# Patient Record
Sex: Female | Born: 1935 | Smoking: Never smoker
Health system: Southern US, Community
[De-identification: ages and names within clinical notes are randomized; demographics above are authoritative.]

## PROBLEM LIST (undated history)

## (undated) DIAGNOSIS — R32 Unspecified urinary incontinence: Secondary | ICD-10-CM

## (undated) DIAGNOSIS — H9190 Unspecified hearing loss, unspecified ear: Secondary | ICD-10-CM

## (undated) DIAGNOSIS — E785 Hyperlipidemia, unspecified: Secondary | ICD-10-CM

## (undated) DIAGNOSIS — I10 Essential (primary) hypertension: Secondary | ICD-10-CM

## (undated) DIAGNOSIS — M1990 Unspecified osteoarthritis, unspecified site: Secondary | ICD-10-CM

## (undated) HISTORY — DX: Unspecified osteoarthritis, unspecified site: M19.90

## (undated) HISTORY — DX: Unspecified hearing loss, unspecified ear: H91.90

## (undated) HISTORY — DX: Essential (primary) hypertension: I10

## (undated) HISTORY — PX: ANAL FISSURE REPAIR: SHX2312

## (undated) HISTORY — PX: HEMORRHOID SURGERY: SHX153

## (undated) HISTORY — DX: Hyperlipidemia, unspecified: E78.5

## (undated) HISTORY — PX: KNEE ARTHROPLASTY: SHX992

## (undated) HISTORY — DX: Unspecified urinary incontinence: R32

## (undated) HISTORY — PX: HIP ARTHROPLASTY: SHX981

---

## 2015-06-01 ENCOUNTER — Encounter: Payer: Self-pay | Admitting: Family Medicine

## 2015-06-01 ENCOUNTER — Ambulatory Visit (INDEPENDENT_AMBULATORY_CARE_PROVIDER_SITE_OTHER): Payer: Medicare Other | Admitting: Family Medicine

## 2015-06-01 VITALS — BP 150/82 | HR 58 | Temp 97.6°F | Ht <= 58 in | Wt 152.0 lb

## 2015-06-01 DIAGNOSIS — R32 Unspecified urinary incontinence: Secondary | ICD-10-CM

## 2015-06-01 DIAGNOSIS — M1990 Unspecified osteoarthritis, unspecified site: Secondary | ICD-10-CM

## 2015-06-01 DIAGNOSIS — E785 Hyperlipidemia, unspecified: Secondary | ICD-10-CM | POA: Diagnosis present

## 2015-06-01 DIAGNOSIS — Z7189 Other specified counseling: Secondary | ICD-10-CM

## 2015-06-01 DIAGNOSIS — I10 Essential (primary) hypertension: Secondary | ICD-10-CM | POA: Diagnosis not present

## 2015-06-01 DIAGNOSIS — Z7689 Persons encountering health services in other specified circumstances: Secondary | ICD-10-CM

## 2015-06-01 NOTE — Patient Instructions (Signed)
Nice to meet you today!  Use Aveeno lotion on your skin Let's see if that helps the rash  On your way out, schedule an appointment one morning to come back for fasting labs. Do not eat or drink anything other than water the morning of your lab appointment until after your labs are drawn.   We will get records from Dr. Deatra JamesKernis  Follow up with me in 1 month  Be well, Dr. Pollie MeyerMcIntyre

## 2015-06-05 ENCOUNTER — Encounter: Payer: Self-pay | Admitting: Family Medicine

## 2015-06-05 DIAGNOSIS — M1990 Unspecified osteoarthritis, unspecified site: Secondary | ICD-10-CM | POA: Insufficient documentation

## 2015-06-05 DIAGNOSIS — R32 Unspecified urinary incontinence: Secondary | ICD-10-CM | POA: Insufficient documentation

## 2015-06-05 DIAGNOSIS — E785 Hyperlipidemia, unspecified: Secondary | ICD-10-CM | POA: Insufficient documentation

## 2015-06-05 NOTE — Assessment & Plan Note (Signed)
Return for fasting labs, titrate statin as needed based on results.

## 2015-06-05 NOTE — Assessment & Plan Note (Signed)
Well controlled on oxybutinin.

## 2015-06-05 NOTE — Assessment & Plan Note (Signed)
Well controlled. Continue current regimen. Return for fasting labs. 

## 2015-06-05 NOTE — Assessment & Plan Note (Signed)
Well controlled with extra strength tylenol, continue.

## 2015-06-05 NOTE — Progress Notes (Signed)
Date of Visit: 06/01/2015    HPI:  Patient presents today for a new patient appointment to establish general primary care. Patient previously a patient of Dr. Deatra JamesKernis in [place] [phone]). Last saw her in November 2016. Recently moved down here to live with son.  Rash - has noticed pink colored areas on skin, especially arms. Ongoing for 4-5 months, occurs occasionally. No fevers, sores in mouth/genitals. Declines dermatology referral  ROS: See HPI ROS sheet positive for: Trouble hearing, horaseness/voice changing, swelling of hands/feet, frequent urination, joint pain/swelling  Past Medical Hx:  -arthritis - previously had both hips and right knee replaced. Now takes extra strength tylenol as needed. Prior doctors were Dr. Tiburcio PeaHarris and [facility] -hypertension - takes atenolol -hyperlipidemia - takes simvastatin -urinary incontinence - takes oxybutinin -decreased hearing - doesn't have hearing aids, can't afford  Past Surgical Hx:  -left hip replacement 2006 -right hip replacement 2013 -right knee replacement 2015 -anal fissure & hemorrhoid surgeries  Obstetrical History: [id number]   Family Hx: updated in Epic  Social Hx: Widowed. Retired. High school graduate. Lives with 641 year old son Michele Mcalpinehil. Son drives her to appts. No regular exercise. Has never used tobacco. No drugs or alcohol. Denies being at risk for having an STD. Denies depressive symptoms.   Health Maintenance:  -colonoscopy - last had 10 years ago. Does not want to ever have another due to age -mammograms - have all been normal. Declines future mammo's due to age -DEXA - reports possible osteopenia, had multiples in the past -Pneumovax - 2015 -stress test - had preoperative stress test 1 year ago which was normal reportedly  PHYSICAL EXAM: BP 150/82 mmHg  Pulse 58  Temp(Src) 97.6 F (36.4 C) (Oral)  Ht 4\' 10"  (1.473 m)  Wt 152 lb (68.947 kg)  BMI 31.78 kg/m2  SpO2 96% Gen:  NAD, pleasant, cooperative HEENT: normocephalic, atraumatic, moist mucous membranes  Heart: regular rate and rhythm, no murmur Lungs: clear to auscultation bilaterally, normal work of breathing  Abdomen: soft, nontender to palpation  Neuro: alert, grossly nonfocal, speech normal Extremities: minimal lower extremity edema bilaterally Skin: faint salmon/pink colored patches on arms  ASSESSMENT/PLAN:  Establish care - patient will complete ROI so we can obtain records from prior PCP.  Rash - no red flags, doubt serious etiology. Declines dermatology referral at this time. Recommend applying aveeno lotion and monitoring for relief. Follow up in 1 month   Essential hypertension, benign Well controlled. Continue current regimen. Return for fasting labs  Arthritis Well controlled with extra strength tylenol, continue.  Hyperlipidemia Return for fasting labs, titrate statin as needed based on results.  Urinary incontinence Well controlled on oxybutinin.     FOLLOW UP: Follow up in 1 month for rash & establishing care once records available. Schedule fasting lab visit   GrenadaBrittany J. Pollie MeyerMcIntyre, MD [facility]

## 2015-06-15 ENCOUNTER — Other Ambulatory Visit: Payer: Medicare Other

## 2015-06-15 DIAGNOSIS — E785 Hyperlipidemia, unspecified: Secondary | ICD-10-CM

## 2015-06-15 LAB — CBC
HCT: 39.8 % (ref 35.0–45.0)
HEMOGLOBIN: 13 g/dL (ref 11.7–15.5)
MCH: 28.2 pg (ref 27.0–33.0)
MCHC: 32.7 g/dL (ref 32.0–36.0)
MCV: 86.3 fL (ref 80.0–100.0)
MPV: 11.6 fL (ref 7.5–12.5)
Platelets: 241 10*3/uL (ref 140–400)
RBC: 4.61 MIL/uL (ref 3.80–5.10)
RDW: 15.1 % — ABNORMAL HIGH (ref 11.0–15.0)
WBC: 7.8 10*3/uL (ref 3.8–10.8)

## 2015-06-15 NOTE — Progress Notes (Signed)
Labs done today Savon Bordonaro 

## 2015-06-16 ENCOUNTER — Encounter: Payer: Self-pay | Admitting: Family Medicine

## 2015-06-16 LAB — COMPLETE METABOLIC PANEL WITH GFR
ALBUMIN: 3.9 g/dL (ref 3.6–5.1)
ALK PHOS: 56 U/L (ref 33–130)
ALT: 11 U/L (ref 6–29)
AST: 16 U/L (ref 10–35)
BUN: 18 mg/dL (ref 7–25)
CALCIUM: 9.4 mg/dL (ref 8.6–10.4)
CHLORIDE: 108 mmol/L (ref 98–110)
CO2: 20 mmol/L (ref 20–31)
Creat: 0.61 mg/dL (ref 0.60–0.88)
GFR, EST NON AFRICAN AMERICAN: 81 mL/min (ref >=60)
GFR, Est African American: >89 mL/min (ref >=60)
Glucose, Bld: 87 mg/dL (ref 65–99)
POTASSIUM: 4.7 mmol/L (ref 3.5–5.3)
SODIUM: 141 mmol/L (ref 135–146)
Total Bilirubin: 0.5 mg/dL (ref 0.2–1.2)
Total Protein: 6.9 g/dL (ref 6.1–8.1)

## 2015-06-16 LAB — LIPID PANEL
CHOL/HDL RATIO: 3 ratio (ref <=5.0)
CHOLESTEROL: 256 mg/dL — AB (ref 125–200)
HDL: 84 mg/dL (ref >=46)
LDL Cholesterol: 141 mg/dL — ABNORMAL HIGH (ref <130)
Triglycerides: 154 mg/dL — ABNORMAL HIGH (ref <150)
VLDL: 31 mg/dL — AB (ref <30)

## 2015-06-29 ENCOUNTER — Ambulatory Visit: Payer: Medicare Other | Admitting: Family Medicine

## 2016-08-13 ENCOUNTER — Ambulatory Visit: Payer: Medicare Other | Admitting: Family Medicine

## 2016-08-17 DIAGNOSIS — E785 Hyperlipidemia, unspecified: Secondary | ICD-10-CM | POA: Diagnosis not present

## 2016-08-17 DIAGNOSIS — R011 Cardiac murmur, unspecified: Secondary | ICD-10-CM | POA: Diagnosis not present

## 2016-08-17 DIAGNOSIS — R7309 Other abnormal glucose: Secondary | ICD-10-CM | POA: Diagnosis not present

## 2016-08-17 DIAGNOSIS — Z6832 Body mass index (BMI) 32.0-32.9, adult: Secondary | ICD-10-CM | POA: Diagnosis not present

## 2016-08-17 DIAGNOSIS — E784 Other hyperlipidemia: Secondary | ICD-10-CM | POA: Diagnosis not present

## 2016-08-17 DIAGNOSIS — R000 Tachycardia, unspecified: Secondary | ICD-10-CM | POA: Diagnosis not present

## 2016-08-17 DIAGNOSIS — N3281 Overactive bladder: Secondary | ICD-10-CM | POA: Diagnosis not present

## 2016-08-17 DIAGNOSIS — R079 Chest pain, unspecified: Secondary | ICD-10-CM | POA: Diagnosis not present

## 2016-08-17 DIAGNOSIS — J0100 Acute maxillary sinusitis, unspecified: Secondary | ICD-10-CM | POA: Diagnosis not present

## 2016-08-17 DIAGNOSIS — I10 Essential (primary) hypertension: Secondary | ICD-10-CM | POA: Diagnosis not present

## 2016-08-27 DIAGNOSIS — N3281 Overactive bladder: Secondary | ICD-10-CM | POA: Diagnosis not present

## 2016-08-27 DIAGNOSIS — R011 Cardiac murmur, unspecified: Secondary | ICD-10-CM | POA: Diagnosis not present

## 2016-08-27 DIAGNOSIS — R7309 Other abnormal glucose: Secondary | ICD-10-CM | POA: Diagnosis not present

## 2016-08-27 DIAGNOSIS — I10 Essential (primary) hypertension: Secondary | ICD-10-CM | POA: Diagnosis not present

## 2016-08-27 DIAGNOSIS — E785 Hyperlipidemia, unspecified: Secondary | ICD-10-CM | POA: Diagnosis not present

## 2016-08-27 DIAGNOSIS — E669 Obesity, unspecified: Secondary | ICD-10-CM | POA: Diagnosis not present

## 2017-06-07 ENCOUNTER — Ambulatory Visit (INDEPENDENT_AMBULATORY_CARE_PROVIDER_SITE_OTHER): Payer: Medicare Other | Admitting: Family Medicine

## 2017-06-07 VITALS — BP 140/80 | HR 58 | Temp 98.7°F | Wt 153.6 lb

## 2017-06-07 DIAGNOSIS — I10 Essential (primary) hypertension: Secondary | ICD-10-CM | POA: Diagnosis present

## 2017-06-07 DIAGNOSIS — Z23 Encounter for immunization: Secondary | ICD-10-CM | POA: Diagnosis not present

## 2017-06-07 DIAGNOSIS — R32 Unspecified urinary incontinence: Secondary | ICD-10-CM | POA: Diagnosis not present

## 2017-06-07 DIAGNOSIS — J309 Allergic rhinitis, unspecified: Secondary | ICD-10-CM

## 2017-06-07 DIAGNOSIS — H6121 Impacted cerumen, right ear: Secondary | ICD-10-CM

## 2017-06-07 MED ORDER — CETIRIZINE HCL 5 MG PO TABS: 5.0000 mg | ORAL_TABLET | Freq: Every day | ORAL | 1 refills | Status: DC

## 2017-06-07 MED ORDER — OXYBUTYNIN CHLORIDE 5 MG PO TABS: 5.0000 mg | ORAL_TABLET | Freq: Two times a day (BID) | ORAL | 1 refills | Status: AC

## 2017-06-07 MED ORDER — ATENOLOL 50 MG PO TABS: 50.0000 mg | ORAL_TABLET | Freq: Two times a day (BID) | ORAL | 1 refills | Status: DC

## 2017-06-07 NOTE — Patient Instructions (Signed)
It was great to see you again today!  Start zyrtec 5mg  daily for allergies Sending you to ENT doctor for wax removal Flu shot today  Sent in cheaper version of oxybutynin  Also refilled atenolol  Follow up with me in 3 months   Be well, Dr. Pollie MeyerMcIntyre

## 2017-06-07 NOTE — Assessment & Plan Note (Signed)
-  Well controlled; continue current regimen -Atenolol refilled today

## 2017-06-07 NOTE — Progress Notes (Signed)
Date of Visit: 06/07/2017   HPI:  Patient presents today for a well woman exam. I have seen her just once before in March 2017, when [facility]. She reports in the interim she has spent time in [place] and seen a PCP there as well.  Concerns today: 1. Medication refills: atenolol, oxybutynin - Does not have any remaining refills on atenolol which she takes for her BP and would like to get this refilled - Uses oxybutynin daily as needed for urinary incontinence when she is going out in public, but is concerned by how expensive the medication is; would like a similar medication if it is available at a lower cost. Has some incontinence symptoms whenever she coughs or sneezes.  2. Hearing difficulty - Hearing has progressively declined over the past 4 years, but hearing loss seems to have declined significantly over the past 2 weeks - Hearing loss is worse is right ear than left - Endorses feeling congested as described below  3. "Head is feeling clogged" - Describes sensation as feeling "like I am in a tunnel" with echoing inside her head - Began about 2 weeks ago - Also has had episodic sneezing over past 2 weeks - Denies fever, coughing, dyspnea, and feeling ill - [place] - Had not tried taking anything to relieve symptoms  4. Interested in receiving flu shot today  Exercise: Used walker which limits mobility. Stays active cleaning and completing other chores around the house.  ROS: See HPI  PMFSH: Hypertension, hyperlipidemia, urinary incontinence  PHYSICAL EXAM: BP 140/80 (BP Location: Left Arm, Patient Position: Sitting, Cuff Size: Normal)   Pulse (!) 58   Temp 98.7 F (37.1 C) (Oral)   Wt 153 lb 9.6 oz (69.7 kg)   SpO2 98%   BMI 32.10 kg/m   Gen: NAD, pleasant, cooperative HEENT: NCAT, left TM normal, unable to visualize right TM due to obstructing cerumen in ear  canal Heart: RRR, no murmurs  Lungs: CTAB, NWOB Abdomen: soft, nontender to palpation Neuro: grossly nonfocal, speech normal Extremities: mild lower extremity edema, equal bilaterally, wearing compression hose  ASSESSMENT/PLAN:   Essential hypertension, benign -Well controlled; continue current regimen -Atenolol refilled today  Urinary incontinence -Symptoms are consistent with stress incontinence, well-controlled on oxybutynin -Changed medication to less expensive, short-acting form of oxybutynin  Hearing difficulty Hearing decline over past 2 weeks is most likely due to a combination of impacted cerumen in right ear and allergic rhinitis causing sinus and nasal congestion. - Impacted cerumen, right ear: Attempted cerumen removal with irrigation and curette without success. Referred to ENT for removal of impacted cerumen. - Allergic rhinitis: See below  Allergic rhinitis Patient's sinus/nasal congestion, concurrent episodic sneezing with lack of infectious symptoms and recent move to a new house make allergic rhinitis the most likely cause of her head feeling clogged. This congestion is likely also contributing to her recent hearing loss. - Started Zyrtec 5 mg daily  Health maintenance:  -immunizations: Influenza vaccine received today. Waiting on outside medical records to determine if other immunizations are indicated. -lipid screening: Waiting on outside medical records. ROI form signed today. -DEXA: Waiting on outside medical records. Discuss at next visit if no DEXA in past 2 years.  FOLLOW UP: Return in 3 months for follow up of issues above.   Note written by Gracy BruinsMichelle Ikoma, MS3  Patient seen along with MS3 student  Gracy Bruins. I personally evaluated this patient along with the student, and verified all aspects of the history, physical exam, and medical decision making as documented by the student. I agree with the student's documentation and have made all necessary  edits.  Levert Feinstein, MD [facility]

## 2017-06-07 NOTE — Assessment & Plan Note (Addendum)
-  Symptoms are consistent with stress incontinence, well-controlled on oxybutynin -Changed medication to less expensive, short-acting form of oxybutynin

## 2017-06-17 DIAGNOSIS — H6121 Impacted cerumen, right ear: Secondary | ICD-10-CM | POA: Diagnosis not present

## 2017-06-17 DIAGNOSIS — Z822 Family history of deafness and hearing loss: Secondary | ICD-10-CM | POA: Diagnosis not present

## 2017-06-17 DIAGNOSIS — H9193 Unspecified hearing loss, bilateral: Secondary | ICD-10-CM | POA: Diagnosis not present

## 2017-06-27 DIAGNOSIS — H903 Sensorineural hearing loss, bilateral: Secondary | ICD-10-CM | POA: Diagnosis not present

## 2017-07-26 ENCOUNTER — Telehealth: Payer: Self-pay

## 2017-07-26 NOTE — Telephone Encounter (Signed)
Pt's son (did not leave name) called nurse line, states his mother had a hearing test a while back and no one ever called her with the results. Pt is wanting results of hearing test and recommendations. Call back [phone] Shawna Orleans, RN

## 2017-08-09 NOTE — Telephone Encounter (Signed)
I am belated in replying to this, was waiting to see if I ever received results from audiologist. I have not received any results from hearing testing. Please ask patient's son to contact the audiologist where she had the testing done to receive results.  Thanks! Latrelle DodrillBrittany J Jamina Macbeth, MD

## 2017-08-12 NOTE — Telephone Encounter (Signed)
Pt son informed but was very rude when talking to him. Deseree Bruna PotterBlount, CMA

## 2017-09-06 ENCOUNTER — Encounter: Payer: Self-pay | Admitting: Family Medicine

## 2017-09-06 ENCOUNTER — Ambulatory Visit (INDEPENDENT_AMBULATORY_CARE_PROVIDER_SITE_OTHER): Payer: Medicare Other | Admitting: Family Medicine

## 2017-09-06 ENCOUNTER — Other Ambulatory Visit: Payer: Self-pay

## 2017-09-06 DIAGNOSIS — I10 Essential (primary) hypertension: Secondary | ICD-10-CM | POA: Diagnosis not present

## 2017-09-06 DIAGNOSIS — R32 Unspecified urinary incontinence: Secondary | ICD-10-CM

## 2017-09-06 NOTE — Progress Notes (Signed)
Date of Visit: 09/06/2017   HPI:  Patient presents for routine follow up. I still have not received records from her prior PCP. Patient brought back signed ROI form today.  Hypertension - doesn't check blood pressure at home. No chest pain or shortness of breath. Occasional swelling of R leg, but notes this is chronic and stable, has history of R knee & hip replacement in the past. Current medication includes atenolol 50mg  twice daily.  Incontinence related discomfort: Has burning on skin if she is incontinent at night when urine sits on pad against her skin. Taking oxybutinyn 5mg  at night. Denies having skin lesions or rashes in pelvic area. No burning when the area is not wet against her skin.  Audiology results: saw ENT for cerumen removal and allegedly had hearing testing done but has not heard of those results. Has tried calling ENT multiple times for results without any success.  ROS: See HPI.  PMFSH: history of hypertension, urinary incontinence, arthritis, hyperlipidemia   PHYSICAL EXAM: BP (!) 162/88   Pulse 78   Temp 97.8 F (36.6 C) (Oral)   Ht 4\' 10"  (1.473 m)   Wt 154 lb (69.9 kg)   SpO2 98%   BMI 32.19 kg/m  Gen: no acute distress, pleasant, cooperative, well appearing HEENT: normocephalic, atraumatic  Heart: regular rate and rhythm, no murmur Lungs: clear to auscultation bilaterally, normal work of breathing  Neuro: alert, grossly nonfocal, speech normal Ext: minimal lower extremity edema bilaterally, R >L  ASSESSMENT/PLAN:  Health maintenance:  -await outside records before deciding on DEXA, Tdap, pneumovax  We will contact ENT office for results of audiology testing.  Essential hypertension, benign Blood pressure above goal today but patient had busy morning and was running behind. Able to check BP at home. Will have her check at home and if persistently above 150/90 she is to let us know. Otherwise continue atenolol.  Urinary incontinence Continue  oxybutynin as patient likes this dose. For skin irritation from incontinence, recommend barrier cream with zinc-oxide based cream. Encouraged her to buy this over the counter. Advised against putting into labial folds or into vagina. Patient reports no lesions or rashes in pelvic area so did not perform external exam today, but if symptoms persist will need to do this.  FOLLOW UP: Follow up in 6 months for routine medical issues.  GrenadaBrittany J. Pollie MeyerMcIntyre, MD [facility]

## 2017-09-06 NOTE — Assessment & Plan Note (Signed)
Blood pressure above goal today but patient had busy morning and was running behind. Able to check BP at home. Will have her check at home and if persistently above 150/90 she is to let us know. Otherwise continue atenolol.

## 2017-09-06 NOTE — Patient Instructions (Addendum)
Buy any zinc oxide based diaper cream and apply this to your skin, see how it helps.  We will call ENT office about hearing results.  Blood pressure is up a little today. Check it at home and call if persistently above 150/90. Or can schedule nurse blood pressure check on a day you aren't stressed.  Follow up with me in 6 months, sooner if needed or if discomfort does not get better.  Be well, Dr. Pollie MeyerMcIntyre

## 2017-09-06 NOTE — Assessment & Plan Note (Signed)
Continue oxybutynin as patient likes this dose. For skin irritation from incontinence, recommend barrier cream with zinc-oxide based cream. Encouraged her to buy this over the counter. Advised against putting into labial folds or into vagina. Patient reports no lesions or rashes in pelvic area so did not perform external exam today, but if symptoms persist will need to do this.

## 2017-10-11 ENCOUNTER — Telehealth: Payer: Self-pay | Admitting: Family Medicine

## 2017-10-11 NOTE — Telephone Encounter (Signed)
Red team,  Can you call ENT office and request records for patient's audiology testing? The patient had never received the results and I told her we'd check on getting them.  Thanks Latrelle DodrillBrittany J Marely Apgar, MD

## 2017-10-14 NOTE — Telephone Encounter (Signed)
LMOVM for the audiology dept at [facility] to send results. Deseree Bruna PotterBlount, CMA

## 2017-10-24 ENCOUNTER — Telehealth: Payer: Self-pay | Admitting: Family Medicine

## 2017-10-24 NOTE — Telephone Encounter (Signed)
Red team,  Please let patient know that we were able to get the results of her hearing testing. She has some hearing loss in both ears. They recommended either yearly hearing testing, or if she wants to try getting hearing aids this would be reasonable as well.   If she has questions I can speak with her. Thanks, Latrelle DodrillBrittany J Janaia Kozel, MD

## 2017-10-30 NOTE — Telephone Encounter (Signed)
Patient's son Erin Baldwin answered phone and was informed of hi mother's bilateral hearing loss and given options as stated by Dr. Pollie MeyerMcIntyre.  Mr. Erin Baldwin said that he understood and would pass along the information to his mother.  Erin Baldwin.Erin Baldwin R, CMA

## 2017-12-13 ENCOUNTER — Other Ambulatory Visit: Payer: Self-pay

## 2017-12-13 ENCOUNTER — Ambulatory Visit (INDEPENDENT_AMBULATORY_CARE_PROVIDER_SITE_OTHER): Payer: Medicare Other | Admitting: Family Medicine

## 2017-12-13 ENCOUNTER — Encounter: Payer: Self-pay | Admitting: Family Medicine

## 2017-12-13 VITALS — BP 122/78 | HR 61 | Temp 97.9°F | Wt 153.0 lb

## 2017-12-13 DIAGNOSIS — M545 Low back pain, unspecified: Secondary | ICD-10-CM | POA: Insufficient documentation

## 2017-12-13 DIAGNOSIS — I10 Essential (primary) hypertension: Secondary | ICD-10-CM | POA: Diagnosis not present

## 2017-12-13 DIAGNOSIS — Z23 Encounter for immunization: Secondary | ICD-10-CM | POA: Diagnosis not present

## 2017-12-13 DIAGNOSIS — R399 Unspecified symptoms and signs involving the genitourinary system: Secondary | ICD-10-CM | POA: Diagnosis not present

## 2017-12-13 LAB — POCT URINALYSIS DIP (MANUAL ENTRY)
BILIRUBIN UA: NEGATIVE
Glucose, UA: NEGATIVE mg/dL
LEUKOCYTES UA: NEGATIVE
Nitrite, UA: NEGATIVE
PH UA: 5.5 (ref 5.0–8.0)
PROTEIN UA: NEGATIVE mg/dL
RBC UA: NEGATIVE
SPEC GRAV UA: 1.025 (ref 1.010–1.025)
Urobilinogen, UA: 0.2 E.U./dL

## 2017-12-13 NOTE — Progress Notes (Signed)
  Subjective:    Patient ID: Erin Baldwin, female    DOB: Apr 02, 1924, 84 y.o.   MRN: [id number]   CC: UTI  HPI:  Low back pain/UTI: Patient reporting low back pain.  Reports that this started on Wednesday morning when she woke up.  Reports that on Monday night it was her birthday and she sat in an uncomfortable recliner for 3 hours watching a movie and then another 2 hours to be visiting with family.  Believes that this could have added to her back pain.  Reports that it improved on Thursday and this morning it still hurts a little bit but it is much better.  Patient reports that she has been taking Tylenol for the pain which is helped.  Denies any dysuria, blood in her urine, increased frequency.  Patient reports that she has burning at night when she voids but this is chronic and she believes is due to her not voiding completely.  Patient reports that she takes oxybutynin when she is leaving the house.  She does not take oxybutynin daily. Patient reports that she has started taking tumeric in order to help with her osteoarthritis.  She has not noticed any improvement but she wanted to make sure that it was safe for her to take this with her current medications. ROS: Patient reports no CVA tenderness, no fevers, no vaginal discharge.    Smoking status reviewed  ROS: 10 point ROS is otherwise negative, except as mentioned in HPI  Patient Active Problem List   Diagnosis Date Noted  . Low back pain 12/13/2017  . Arthritis 06/05/2015  . Hyperlipidemia 06/05/2015  . Urinary incontinence 06/05/2015  . Essential hypertension, benign 06/01/2015     Objective:  BP 122/78   Pulse 61   Temp 97.9 F (36.6 C) (Oral)   Wt 153 lb (69.4 kg)   SpO2 98%   BMI 31.98 kg/m  Vitals and nursing note reviewed  General: NAD, pleasant Cardiac: RRR, normal heart sounds, no murmurs Respiratory: CTAB, normal effort Abdomen: soft, nontender, nondistended Extremities: Minimal nonpitting edema in  bilateral lower extremities and no cyanosis. WWP. Skin: warm and dry, no rashes noted Neuro: alert and oriented, no focal deficits Psych: normal affect  Assessment & Plan:    Essential hypertension, benign Blood pressure 122/78 today.  Patient reports that she is compliant with her atenolol.  Patient denies any chest pain or shortness of breath.  Will check yearly labs including CMP and CBC today.  Low back pain Patient describing low back pain after inciting event of sitting in uncomfortable chair for multiple hours, which is not what patient normally does.  Patient denies any urinary symptoms and UA at presentation is without signs of infection.  Patient encouraged to continue Tylenol as needed and as pain has already improved greatly will not pursue further work-up.  Patient denies any red flag symptoms.  Given return precautions. Advised that she could continue to take tumeric however there is no research showing great benefit.  Health maintenance: Patient to receive flu shot today CMP and CBC  [place] Bryana Froemming, DO Family Medicine Resident PGY-2

## 2017-12-13 NOTE — Assessment & Plan Note (Signed)
Blood pressure 122/78 today.  Patient reports that she is compliant with her atenolol.  Patient denies any chest pain or shortness of breath.  Will check yearly labs including CMP and CBC today.

## 2017-12-13 NOTE — Assessment & Plan Note (Signed)
Patient describing low back pain after inciting event of sitting in uncomfortable chair for multiple hours, which is not what patient normally does.  Patient denies any urinary symptoms and UA at presentation is without signs of infection.  Patient encouraged to continue Tylenol as needed and as pain has already improved greatly will not pursue further work-up.  Patient denies any red flag symptoms.  Given return precautions. Advised that she could continue to take tumeric however there is no research showing great benefit.

## 2017-12-13 NOTE — Patient Instructions (Signed)
Thank you for coming to see me today. It was a pleasure! Today we talked about:   Your back pain. Continue to take you tylenol as needed for pain. You do not have a urinary trcat infection today. We will get some regular lab work for you and call you with the results.   Please follow-up with your primary care doctor in 6 months or sooner as needed.  If you have any questions or concerns, please do not hesitate to call the office at [phone].  Take Care,   [place] Kenyetta Wimbish, DO

## 2017-12-14 LAB — CBC
HEMATOCRIT: 43.4 % (ref 34.0–46.6)
Hemoglobin: 14.2 g/dL (ref 11.1–15.9)
MCH: 28.5 pg (ref 26.6–33.0)
MCHC: 32.7 g/dL (ref 31.5–35.7)
MCV: 87 fL (ref 79–97)
Platelets: 248 10*3/uL (ref 150–450)
RBC: 4.99 x10E6/uL (ref 3.77–5.28)
RDW: 13.5 % (ref 12.3–15.4)
WBC: 9 10*3/uL (ref 3.4–10.8)

## 2017-12-14 LAB — CMP14+EGFR
ALK PHOS: 66 IU/L (ref 39–117)
ALT: 13 IU/L (ref 0–32)
AST: 18 IU/L (ref 0–40)
Albumin/Globulin Ratio: 1.8 (ref 1.2–2.2)
Albumin: 4.4 g/dL (ref 3.2–4.6)
BUN/Creatinine Ratio: 29 — ABNORMAL HIGH (ref 12–28)
BUN: 20 mg/dL (ref 10–36)
Bilirubin Total: 0.5 mg/dL (ref 0.0–1.2)
CALCIUM: 9.7 mg/dL (ref 8.7–10.3)
CO2: 22 mmol/L (ref 20–29)
CREATININE: 0.69 mg/dL (ref 0.57–1.00)
Chloride: 103 mmol/L (ref 96–106)
GFR calc Af Amer: 88 mL/min/{1.73_m2} (ref >59)
GFR, EST NON AFRICAN AMERICAN: 76 mL/min/{1.73_m2} (ref >59)
GLUCOSE: 86 mg/dL (ref 65–99)
Globulin, Total: 2.4 g/dL (ref 1.5–4.5)
Potassium: 4.8 mmol/L (ref 3.5–5.2)
Sodium: 143 mmol/L (ref 134–144)
Total Protein: 6.8 g/dL (ref 6.0–8.5)

## 2018-03-10 ENCOUNTER — Other Ambulatory Visit: Payer: Self-pay | Admitting: Family Medicine

## 2018-06-23 ENCOUNTER — Other Ambulatory Visit: Payer: Self-pay

## 2018-06-23 ENCOUNTER — Encounter (INDEPENDENT_AMBULATORY_CARE_PROVIDER_SITE_OTHER): Payer: Self-pay | Admitting: Ophthalmology

## 2018-06-23 ENCOUNTER — Ambulatory Visit (INDEPENDENT_AMBULATORY_CARE_PROVIDER_SITE_OTHER): Payer: Medicare Other | Admitting: Ophthalmology

## 2018-06-23 VITALS — BP 212/110 | HR 88

## 2018-06-23 DIAGNOSIS — H35033 Hypertensive retinopathy, bilateral: Secondary | ICD-10-CM

## 2018-06-23 DIAGNOSIS — I10 Essential (primary) hypertension: Secondary | ICD-10-CM

## 2018-06-23 DIAGNOSIS — H2513 Age-related nuclear cataract, bilateral: Secondary | ICD-10-CM | POA: Diagnosis not present

## 2018-06-23 DIAGNOSIS — H3561 Retinal hemorrhage, right eye: Secondary | ICD-10-CM

## 2018-06-23 DIAGNOSIS — H35011 Changes in retinal vascular appearance, right eye: Secondary | ICD-10-CM

## 2018-06-23 DIAGNOSIS — H353122 Nonexudative age-related macular degeneration, left eye, intermediate dry stage: Secondary | ICD-10-CM | POA: Diagnosis not present

## 2018-06-23 DIAGNOSIS — H353211 Exudative age-related macular degeneration, right eye, with active choroidal neovascularization: Secondary | ICD-10-CM

## 2018-06-23 DIAGNOSIS — H3581 Retinal edema: Secondary | ICD-10-CM | POA: Diagnosis not present

## 2018-06-23 DIAGNOSIS — H25813 Combined forms of age-related cataract, bilateral: Secondary | ICD-10-CM | POA: Diagnosis not present

## 2018-06-23 DIAGNOSIS — H04123 Dry eye syndrome of bilateral lacrimal glands: Secondary | ICD-10-CM | POA: Diagnosis not present

## 2018-06-23 NOTE — Progress Notes (Signed)
[facility] Note  06/23/2018     CHIEF COMPLAINT Patient presents for Retina Evaluation   HISTORY OF PRESENT ILLNESS: Erin Baldwin is a 85 y.o. female who presents to the clinic today for:   HPI    Retina Evaluation    In both eyes.  This started 5 days ago.  Associated Symptoms Distortion.  Negative for Flashes, Pain, Trauma, Fever, Floaters, Redness, Scalp Tenderness, Weight Loss, Photophobia, Jaw Claudication, Fatigue, Blind Spot, Glare and Shoulder/Hip pain.  Context:  distance vision, mid-range vision and near vision.  Treatments tried include eye drops.  Response to treatment was moderate improvement.  I, the attending physician,  performed the HPI with the patient and updated documentation appropriately.          Comments    Referral of Dr. Katy Fitch retina eval. Patient states appx. 5 days ago she noticed a "large dark spot with a red outline" in her visual field of the OD and wiggly lines noted  while watching t.v, denies flashes , ocular pain and photo sensitivity. Pt is using Systane PRN.         Last edited by Bernarda Caffey, MD on 06/23/2018  8:40 PM. (History)    pt is with her son who she lives with today, he states that she has always had very good vision, her son states that she used to live in [place], but moved her about 5 years ago and has not had an eye exam since she moved, pt states Albert Lundquist at Dr. Zenia Resides office told her she has macular degeneration, but no one in [place] every mentioned it, pt endorses being hypertensive and taking medication for it, she states she does not check her blood pressure at home, but last time it was checked it was good, pt denies being diabetic, pt states 4 or 5 days ago she woke up and noticed a black spot in her vision, she states it is about the same since she first noticed it  Referring physician: Clent Jacks, MD [address] [place], [place] [address]  HISTORICAL INFORMATION:   Selected  notes from the MEDICAL RECORD NUMBER Referred by Shirleen Schirmer PA-C for exu ARMD  LEE: 04.13.20 (A. Lundquist) [BCVA: OD: CF'@3ft'$  OS: 20/30] Ocular Hx-exu ARMD OD, non-exu ARMD OS, NS OU, HTN ret OU, DES OU PMH-HTN, arthritis, HOH    CURRENT MEDICATIONS: No current outpatient medications on file. (Ophthalmic Drugs)   No current facility-administered medications for this visit.  (Ophthalmic Drugs)   Current Outpatient Medications (Other)  Medication Sig  . acetaminophen (TYLENOL) 500 MG tablet Take 500 mg by mouth every 6 (six) hours as needed.  Marland Kitchen atenolol (TENORMIN) 50 MG tablet TAKE 1 TABLET(50 MG) BY MOUTH TWICE DAILY  . cetirizine (ZYRTEC) 5 MG tablet Take 1 tablet (5 mg total) by mouth daily.  Marland Kitchen oxybutynin (DITROPAN) 5 MG tablet Take 1 tablet (5 mg total) by mouth 2 (two) times daily. (Patient taking differently: Take 5 mg by mouth daily. )   No current facility-administered medications for this visit.  (Other)      REVIEW OF SYSTEMS: ROS    Positive for: Eyes   Negative for: Constitutional, Gastrointestinal, Neurological, Skin, Genitourinary, Musculoskeletal, HENT, Endocrine, Cardiovascular, Respiratory, Psychiatric, Allergic/Imm, Heme/Lymph   Last edited by Zenovia Jordan, LPN on 7/82/4235  3:61 PM. (History)       ALLERGIES Allergies  Allergen Reactions  . Penicillins Hives    PAST MEDICAL  HISTORY Past Medical History:  Diagnosis Date  . Arthritis   . Hearing decreased   . Hyperlipidemia   . Hypertension   . Urinary incontinence    Past Surgical History:  Procedure Laterality Date  . ANAL FISSURE REPAIR    . HEMORRHOID SURGERY    . HIP ARTHROPLASTY     left 2006, right 2013  . KNEE ARTHROPLASTY     right 2015    FAMILY HISTORY Family History  Problem Relation Age of Onset  . Breast cancer Sister 44       died age 10  . Esophageal cancer Mother        died in 7's  . Multiple sclerosis Daughter   . Diabetes Daughter   . Hypertension Daughter    . Glaucoma Son     SOCIAL HISTORY Social History   Tobacco Use  . Smoking status: Never Smoker  . Smokeless tobacco: Never Used  Substance Use Topics  . Alcohol use: Not on file  . Drug use: Not on file       Today's Vitals   06/23/18 1629 06/23/18 1630 06/23/18 1631  BP: (!) 218/101 (!) 187/92 (!) 212/110  Pulse: 88        OPHTHALMIC EXAM:  Base Eye Exam    Visual Acuity (Snellen - Linear)      Right Left   Dist [place] 20/400 20/40   Dist ph [place] NI NI       Tonometry (Tonopen, 3:16 PM)      Right Left   Pressure 16 16       Pupils      Dark Shape React   Right 8 Round none   Left 8 Round none  Dilated OU recently       Visual Fields (Counting fingers)      Left Right    Full Full       Extraocular Movement      Right Left    Full, Ortho Full, Ortho       Neuro/Psych    Oriented x3:  Yes   Mood/Affect:  Normal       Dilation    Both eyes:  1.0% Mydriacyl @ 3:16 PM        Slit Lamp and Fundus Exam    Slit Lamp Exam      Right Left   Lids/Lashes Dermatochalasis - upper lid Dermatochalasis - upper lid   Conjunctiva/Sclera White and quiet White and quiet   Cornea Arcus, 1+ Punctate epithelial erosions Arcus, 1+ Punctate epithelial erosions   Anterior Chamber Deep and quiet Deep and quiet   Iris Round and dilated Round and dilated   Lens 2+ Nuclear sclerosis, 3+ Cortical cataract 2+ Nuclear sclerosis, 3+ Cortical cataract   Vitreous Posterior vitreous detachment, Vitreous syneresis Posterior vitreous detachment, Vitreous syneresis       Fundus Exam      Right Left   Disc mildly Tilted disc, Pink and Sharp, mild Peripapillary atrophy mild tilt, Pink and Sharp, mild Peripapillary atrophy   C/D Ratio 0.3 0.4   Macula large intraretinal heme centrally, large subretinal heme along ST arcades, macroaneurysm superior macula, scattered drusen flat, Blunted foveal reflex, Drusen, RPE mottling and clumping, No heme or edema   Vessels mild Vascular  attenuation, venules dilated and tortuous, +macroaneurysm superior macula Vascular attenuation, mild Tortuousity   Periphery Attached, peripheral drusen, reticular degeneration Attached, peripheral drusen, reticular degeneration          IMAGING AND  PROCEDURES  Imaging and Procedures for '@TODAY'$ @  OCT, Retina - OU - Both Eyes       Right Eye Quality was good. Central Foveal Thickness: 758. Progression has no prior data. Findings include subretinal fluid, intraretinal fluid, intraretinal hyper-reflective material, subretinal hyper-reflective material, outer retinal atrophy, abnormal foveal contour, retinal drusen  (Inter-retinal v pre-retinal heme, +sub-retinal heme).   Left Eye Quality was good. Central Foveal Thickness: 259. Progression has no prior data. Findings include normal foveal contour, no SRF, no IRF, retinal drusen , outer retinal atrophy (Partial PVD).   Notes *Images captured and stored on drive  Diagnosis / Impression:  OD: intraretinal and subretinal heme hyperreflective material consistent with IRH and SRH -- retinal macroaneurysm vs exudative ARMD OS: non-exu ARMD   Clinical management:  See below  Abbreviations: NFP - Normal foveal profile. CME - cystoid macular edema. PED - pigment epithelial detachment. IRF - intraretinal fluid. SRF - subretinal fluid. EZ - ellipsoid zone. ERM - epiretinal membrane. ORA - outer retinal atrophy. ORT - outer retinal tubulation. SRHM - subretinal hyper-reflective material                 ASSESSMENT/PLAN:    ICD-10-CM   1. Macular hemorrhage, right H35.61   2. 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 Exudative age-related macular degeneration of right eye with active choroidal neovascularization ([place]) H35.3211   4. Retinal edema H35.81 OCT, Retina - OU - Both Eyes  5. Intermediate stage nonexudative age-related macular degeneration of left eye H35.3122   6. Essential hypertension I10   7. Hypertensive  retinopathy of both eyes H35.033   8. Combined forms of age-related cataract of both eyes H25.813     1-4. Macular hemorrhage OD -- intraretinal and subretinal central hemorrhage - likely retinal macroaneurysm +/- exudative age related macular degeneration component, OD   - OCT shows intraretinal and subretinal hyperreflective material OD - BCVA 20/400 OD - discussed findings and guarded prognosis - The incidence pathology and anatomy of retinal macroaneurysm and wet AMD discussed  - discussed treatment options including observation vs intravitreal anti-VEGF agents such as Avastin, Lucentis, Eylea.   - Risks of endophthalmitis and vascular occlusive events and atrophic changes discussed with patient - recommend IVA OD #1 today, 04.13.2020 - BP extremely high today -- likely contributing to pathology (~200/100 -- checked 3 times) - pt wishes to defer treatment for now -- thinks elevated BP is due to stress - recommend consultation with PCP for better control of BP - pt to return on Thursday for possible injection  5. Age related macular degeneration, non-exudative, OS  - The incidence, anatomy, and pathology of dry AMD, risk of progression, and the AREDS and AREDS 2 study including smoking risks discussed with patient.  - Recommend amsler grid monitoring  6,7. Hypertensive retinopathy OU  - BP severely elevated on 3 separate measurements in office, but pt denies headache, numbness, tingling or any other symptoms related to BP Today's Vitals   06/23/18 1629 06/23/18 1630 06/23/18 1631  BP: (!) 218/101 (!) 187/92 (!) 212/110  Pulse: 88     - discussed importance of tight BP control and likely contribution of BP to retinal pathology  - recommend consultation with PCP for further evaluation and management of hypertension  - monitor  8. Mixed form age related cataract OU  - The symptoms of cataract, surgical options, and treatments and risks were discussed with patient.  - discussed  diagnosis and progression  - not yet  visually significant  - monitor for now    Ophthalmic Meds Ordered this visit:  No orders of the defined types were placed in this encounter.      Return in about 3 days (around 06/26/2018) for f/u macular heme OD -- IVA OD.  There are no Patient Instructions on file for this visit.   Explained the diagnoses, plan, and follow up with the patient and they expressed understanding.  Patient expressed understanding of the importance of proper follow up care.   This document serves as a record of services personally performed by Gardiner Sleeper, MD, PhD. It was created on their behalf by Ernest Mallick, OA, an ophthalmic assistant. The creation of this record is the provider's dictation and/or activities during the visit.    Electronically signed by: Ernest Mallick, OA  04.13.2020 8:48 PM    Gardiner Sleeper, M.D., Ph.D. Diseases & Surgery of the Retina and Vitreous [facility]  I have reviewed the above documentation for accuracy and completeness, and I agree with the above. Gardiner Sleeper, M.D., Ph.D. 06/23/18 9:00 PM      Abbreviations: M myopia (nearsighted); A astigmatism; H hyperopia (farsighted); P presbyopia; Mrx spectacle prescription;  CTL contact lenses; OD right eye; OS left eye; OU both eyes  XT exotropia; ET esotropia; PEK punctate epithelial keratitis; PEE punctate epithelial erosions; DES dry eye syndrome; MGD meibomian gland dysfunction; ATs artificial tears; PFAT's preservative free artificial tears; [place] nuclear sclerotic cataract; PSC posterior subcapsular cataract; ERM epi-retinal membrane; PVD posterior vitreous detachment; RD retinal detachment; DM diabetes mellitus; DR diabetic retinopathy; NPDR non-proliferative diabetic retinopathy; PDR proliferative diabetic retinopathy; CSME clinically significant macular edema; DME diabetic macular edema; dbh dot blot hemorrhages; CWS cotton wool spot; POAG primary open  angle glaucoma; C/D cup-to-disc ratio; HVF humphrey visual field; GVF goldmann visual field; OCT optical coherence tomography; IOP intraocular pressure; BRVO Branch retinal vein occlusion; CRVO central retinal vein occlusion; CRAO central retinal artery occlusion; BRAO branch retinal artery occlusion; RT retinal tear; SB scleral buckle; PPV pars plana vitrectomy; VH Vitreous hemorrhage; PRP panretinal laser photocoagulation; IVK intravitreal kenalog; VMT vitreomacular traction; MH Macular hole;  NVD neovascularization of the disc; NVE neovascularization elsewhere; AREDS age related eye disease study; ARMD age related macular degeneration; POAG primary open angle glaucoma; EBMD epithelial/anterior basement membrane dystrophy; ACIOL anterior chamber intraocular lens; IOL intraocular lens; PCIOL posterior chamber intraocular lens; Phaco/IOL phacoemulsification with intraocular lens placement; [place] photorefractive keratectomy; LASIK laser assisted in situ keratomileusis; HTN hypertension; DM diabetes mellitus; COPD chronic obstructive pulmonary disease

## 2018-06-25 NOTE — Progress Notes (Signed)
[facility] Note  06/26/2018     CHIEF COMPLAINT Patient presents for Retina Follow Up   HISTORY OF PRESENT ILLNESS: Erin Baldwin is a 85 y.o. female who presents to the clinic today for:   HPI    Retina Follow Up    Patient presents with  Other (Macular heme OD).  In right eye.  Severity is severe.  Duration of 3 days.  Since onset it is gradually improving.  I, the attending physician,  performed the HPI with the patient and updated documentation appropriately.          Comments    Patient's son states that this am, patient could see more of his face with OD than at initial visit on 06/23/2018. Patient started taking AREDS 2 vitamins.        Last edited by Bernarda Caffey, MD on 06/26/2018 11:17 AM. (History)    Pt here for intravitreal injection OD for subretinal hemorrhage. Pt deferred treatment last visit due to elevated BP -- SBP >200, DBP >100 in left arm. Advised consultation with PCP for emergent management of BP, but pt and son state that they have not spoken to primary care nor have they checked BP at home. Pt reports no change in symptoms -- denies chest pain, headache, numbness, tingling.  Referring physician: Leeanne Rio, MD [place], [place] [address]  HISTORICAL INFORMATION:   Selected notes from the MEDICAL RECORD NUMBER Referred by Shirleen Schirmer PA-C for exu ARMD  LEE: 04.13.20 (A. Lundquist) [BCVA: OD: CF'@3ft'$  OS: 20/30] Ocular Hx-exu ARMD OD, non-exu ARMD OS, NS OU, HTN ret OU, DES OU PMH-HTN, arthritis, HOH    CURRENT MEDICATIONS: No current outpatient medications on file. (Ophthalmic Drugs)   No current facility-administered medications for this visit.  (Ophthalmic Drugs)   Current Outpatient Medications (Other)  Medication Sig  . acetaminophen (TYLENOL) 500 MG tablet Take 500 mg by mouth every 6 (six) hours as needed.  Marland Kitchen atenolol (TENORMIN) 50 MG tablet TAKE 1 TABLET(50 MG) BY MOUTH TWICE  DAILY  . cetirizine (ZYRTEC) 5 MG tablet Take 1 tablet (5 mg total) by mouth daily.  Marland Kitchen oxybutynin (DITROPAN) 5 MG tablet Take 1 tablet (5 mg total) by mouth 2 (two) times daily. (Patient taking differently: Take 5 mg by mouth daily. )  . amLODipine (NORVASC) 5 MG tablet Take 1 tablet (5 mg total) by mouth daily.   No current facility-administered medications for this visit.  (Other)      REVIEW OF SYSTEMS: ROS    Positive for: Eyes   Negative for: Constitutional, Gastrointestinal, Neurological, Skin, Genitourinary, Musculoskeletal, HENT, Endocrine, Cardiovascular, Respiratory, Psychiatric, Allergic/Imm, Heme/Lymph   Last edited by Roselee Nova D on 06/26/2018  9:33 AM. (History)       ALLERGIES Allergies  Allergen Reactions  . Penicillins Hives    PAST MEDICAL HISTORY Past Medical History:  Diagnosis Date  . Arthritis   . Hearing decreased   . Hyperlipidemia   . Hypertension   . Urinary incontinence    Past Surgical History:  Procedure Laterality Date  . ANAL FISSURE REPAIR    . HEMORRHOID SURGERY    . HIP ARTHROPLASTY     left 2006, right 2013  . KNEE ARTHROPLASTY     right 2015    FAMILY HISTORY Family History  Problem Relation Age of Onset  . Breast cancer Sister 27       died age 72  . Esophageal  cancer Mother        died in 17's  . Multiple sclerosis Daughter   . Diabetes Daughter   . Hypertension Daughter   . Glaucoma Son     SOCIAL HISTORY Social History   Tobacco Use  . Smoking status: Never Smoker  . Smokeless tobacco: Never Used  Substance Use Topics  . Alcohol use: Yes    Alcohol/week: 0.0 standard drinks    Comment: occaional  . Drug use: Never       Today's Vitals   06/26/18 0958 06/26/18 0959 06/26/18 1000  BP: (!) 246/114 (!) 181/101 (!) 246/100  Pulse: 77 76 78      OPHTHALMIC EXAM:  Base Eye Exam    Visual Acuity (Snellen - Linear)      Right Left   Dist [place] 20/400 -2 20/30 +2   Dist ph [place] 20/300 -2 20/30 +2        Tonometry (Tonopen, 9:30 AM)      Right Left   Pressure 16 13       Pupils      Dark Light Shape React APD   Right 5 4 Round Slow None   Left 5 4 Round Brisk None       Visual Fields      Left Right    Full    Restrictions  Central scotoma  Small central scotoma OD, not to scale       Extraocular Movement      Right Left    Full, Ortho Full, Ortho       Neuro/Psych    Oriented x3:  Yes   Mood/Affect:  Normal       Dilation    Right eye:  1.0% Mydriacyl, 2.5% Phenylephrine @ 9:30 AM        Slit Lamp and Fundus Exam    Slit Lamp Exam      Right Left   Lids/Lashes Dermatochalasis - upper lid Dermatochalasis - upper lid   Conjunctiva/Sclera White and quiet White and quiet   Cornea Arcus, 1+ Punctate epithelial erosions Arcus, 1+ Punctate epithelial erosions   Anterior Chamber Deep and quiet Deep and quiet   Iris Round and dilated Round and dilated   Lens 2+ Nuclear sclerosis, 3+ Cortical cataract 2+ Nuclear sclerosis, 3+ Cortical cataract   Vitreous Posterior vitreous detachment, Vitreous syneresis Posterior vitreous detachment, Vitreous syneresis       Fundus Exam      Right Left   Disc mildly Tilted disc, Pink and Sharp, mild Peripapillary atrophy    C/D Ratio 0.3 0.4   Macula large intraretinal heme centrally, large subretinal heme along ST arcades, macroaneurysm superior macula, scattered drusen    Vessels mild Vascular attenuation, venules dilated and tortuous, +macroaneurysm superior macula    Periphery Attached, peripheral drusen, reticular degeneration           IMAGING AND PROCEDURES  Imaging and Procedures for '@TODAY'$ @  OCT, Retina - OU - Both Eyes       Right Eye Quality was good. Central Foveal Thickness: 707. Progression has been stable. Findings include subretinal fluid, intraretinal fluid, intraretinal hyper-reflective material, subretinal hyper-reflective material, outer retinal atrophy, abnormal foveal contour, retinal drusen   (Inter-retinal v pre-retinal heme, +sub-retinal heme).   Left Eye Quality was good. Central Foveal Thickness: 247. Progression has been stable. Findings include normal foveal contour, no SRF, no IRF, retinal drusen , outer retinal atrophy (Partial PVD).   Notes *Images captured and stored on drive  Diagnosis / Impression:  OD: intraretinal and subretinal heme hyperreflective material consistent with IRH and SRH -- retinal macroaneurysm vs exudative ARMD OS: non-exu ARMD   Clinical management:  See below  Abbreviations: NFP - Normal foveal profile. CME - cystoid macular edema. PED - pigment epithelial detachment. IRF - intraretinal fluid. SRF - subretinal fluid. EZ - ellipsoid zone. ERM - epiretinal membrane. ORA - outer retinal atrophy. ORT - outer retinal tubulation. SRHM - subretinal hyper-reflective material                 ASSESSMENT/PLAN:    ICD-10-CM   1. Macular hemorrhage, right H35.61   2. 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 Exudative age-related macular degeneration of right eye with active choroidal neovascularization ([place]) H35.3211   4. Retinal edema H35.81 OCT, Retina - OU - Both Eyes  5. Intermediate stage nonexudative age-related macular degeneration of left eye H35.3122   6. Essential hypertension I10   7. Hypertensive retinopathy of both eyes H35.033   8. Combined forms of age-related cataract of both eyes H25.813     1-4. Macular hemorrhage OD -- intraretinal and subretinal central hemorrhage - likely retinal macroaneurysm +/- exudative age related macular degeneration component, OD   - OCT shows intraretinal and subretinal hyperreflective material OD - BCVA 20/400 OD - discussed findings and guarded prognosis - The incidence pathology and anatomy of retinal macroaneurysm and wet AMD discussed  - discussed treatment options including observation vs intravitreal anti-VEGF agents such as Avastin, Lucentis, Eylea.   - Risks of endophthalmitis  and vascular occlusive events and atrophic changes discussed with patient - returned to clinic today for IVA OD #1, but BP extremely high today -- likely contributing to pathology (~200/100 -- checked 3 times) - recommend holding off on IVA again today until BP better controlled - spoke with Dr. Azzie Roup at [facility] as regular doctor, Dr. Chrisandra Netters, is out of office -- Dr. Erin Hearing agreed to see pt today as an add-on to avoid presentation to ED  5. Age related macular degeneration, non-exudative, OS  - The incidence, anatomy, and pathology of dry AMD, risk of progression, and the AREDS and AREDS 2 study including smoking risks discussed with patient.  - recommend amsler grid monitoring  6,7. Hypertensive retinopathy OU  - BP remains severely elevated on 3 separate measurements in office, but pt denies headache, numbness, tingling or any other symptoms related to BP Today's Vitals   06/26/18 0958 06/26/18 0959 06/26/18 1000  BP: (!) 246/114 (!) 181/101 (!) 246/100  Pulse: 77 76 78   - discussed importance of tight BP control and likely contribution of BP to retinal pathology  - pt to [facility] directly to [facility] upon discharge here for urgent add-on appointment with Dr. Erin Hearing as discussed above  8. Mixed form age related cataract OU  - The symptoms of cataract, surgical options, and treatments and risks were discussed with patient.  - discussed diagnosis and progression  - not yet visually significant  - monitor for now    Ophthalmic Meds Ordered this visit:  No orders of the defined types were placed in this encounter.      Return in about 1 week (around 07/03/2018) for Dilated Exam, OCT, Possible Injxn.  There are no Patient Instructions on file for this visit.   Explained the diagnoses, plan, and follow up with the patient and they expressed understanding.  Patient expressed understanding of the importance  of  proper follow up care.   This document serves as a record of services personally performed by Gardiner Sleeper, MD, PhD. It was created on their behalf by Ernest Mallick, OA, an ophthalmic assistant. The creation of this record is the provider's dictation and/or activities during the visit.    Electronically signed by: Ernest Mallick, OA  04.15.2020 12:32 PM     Gardiner Sleeper, M.D., Ph.D. Diseases & Surgery of the Retina and Vitreous [facility]   I have reviewed the above documentation for accuracy and completeness, and I agree with the above. Gardiner Sleeper, M.D., Ph.D. 06/26/18 12:32 PM      Abbreviations: M myopia (nearsighted); A astigmatism; H hyperopia (farsighted); P presbyopia; Mrx spectacle prescription;  CTL contact lenses; OD right eye; OS left eye; OU both eyes  XT exotropia; ET esotropia; PEK punctate epithelial keratitis; PEE punctate epithelial erosions; DES dry eye syndrome; MGD meibomian gland dysfunction; ATs artificial tears; PFAT's preservative free artificial tears; [place] nuclear sclerotic cataract; PSC posterior subcapsular cataract; ERM epi-retinal membrane; PVD posterior vitreous detachment; RD retinal detachment; DM diabetes mellitus; DR diabetic retinopathy; NPDR non-proliferative diabetic retinopathy; PDR proliferative diabetic retinopathy; CSME clinically significant macular edema; DME diabetic macular edema; dbh dot blot hemorrhages; CWS cotton wool spot; POAG primary open angle glaucoma; C/D cup-to-disc ratio; HVF humphrey visual field; GVF goldmann visual field; OCT optical coherence tomography; IOP intraocular pressure; BRVO Branch retinal vein occlusion; CRVO central retinal vein occlusion; CRAO central retinal artery occlusion; BRAO branch retinal artery occlusion; RT retinal tear; SB scleral buckle; PPV pars plana vitrectomy; VH Vitreous hemorrhage; PRP panretinal laser photocoagulation; IVK intravitreal kenalog; VMT vitreomacular traction;  MH Macular hole;  NVD neovascularization of the disc; NVE neovascularization elsewhere; AREDS age related eye disease study; ARMD age related macular degeneration; POAG primary open angle glaucoma; EBMD epithelial/anterior basement membrane dystrophy; ACIOL anterior chamber intraocular lens; IOL intraocular lens; PCIOL posterior chamber intraocular lens; Phaco/IOL phacoemulsification with intraocular lens placement; [place] photorefractive keratectomy; LASIK laser assisted in situ keratomileusis; HTN hypertension; DM diabetes mellitus; COPD chronic obstructive pulmonary disease

## 2018-06-26 ENCOUNTER — Encounter (INDEPENDENT_AMBULATORY_CARE_PROVIDER_SITE_OTHER): Payer: Self-pay | Admitting: Ophthalmology

## 2018-06-26 ENCOUNTER — Ambulatory Visit (INDEPENDENT_AMBULATORY_CARE_PROVIDER_SITE_OTHER): Payer: Medicare Other | Admitting: Ophthalmology

## 2018-06-26 ENCOUNTER — Other Ambulatory Visit: Payer: Self-pay | Admitting: Family Medicine

## 2018-06-26 ENCOUNTER — Telehealth: Payer: Self-pay | Admitting: Family Medicine

## 2018-06-26 ENCOUNTER — Ambulatory Visit (INDEPENDENT_AMBULATORY_CARE_PROVIDER_SITE_OTHER): Payer: Medicare Other | Admitting: Family Medicine

## 2018-06-26 ENCOUNTER — Other Ambulatory Visit: Payer: Self-pay

## 2018-06-26 VITALS — BP 246/100 | HR 78

## 2018-06-26 DIAGNOSIS — H35033 Hypertensive retinopathy, bilateral: Secondary | ICD-10-CM | POA: Diagnosis not present

## 2018-06-26 DIAGNOSIS — I10 Essential (primary) hypertension: Secondary | ICD-10-CM

## 2018-06-26 DIAGNOSIS — H35011 Changes in retinal vascular appearance, right eye: Secondary | ICD-10-CM

## 2018-06-26 DIAGNOSIS — H3561 Retinal hemorrhage, right eye: Secondary | ICD-10-CM | POA: Diagnosis not present

## 2018-06-26 DIAGNOSIS — H25813 Combined forms of age-related cataract, bilateral: Secondary | ICD-10-CM | POA: Diagnosis not present

## 2018-06-26 DIAGNOSIS — H3581 Retinal edema: Secondary | ICD-10-CM

## 2018-06-26 DIAGNOSIS — H353122 Nonexudative age-related macular degeneration, left eye, intermediate dry stage: Secondary | ICD-10-CM | POA: Diagnosis not present

## 2018-06-26 DIAGNOSIS — H353211 Exudative age-related macular degeneration, right eye, with active choroidal neovascularization: Secondary | ICD-10-CM

## 2018-06-26 MED ORDER — AMLODIPINE BESYLATE 5 MG PO TABS: 5.0000 mg | ORAL_TABLET | Freq: Every day | ORAL | 0 refills | Status: DC

## 2018-06-26 NOTE — Telephone Encounter (Signed)
Call from Dr Vanessa Barbara Ophtho Her blood pressure in office 246/114 R and 181/100 on left Totally asymptomatic - no chest pain headache etc Did not want to send to ER with covid risk Will send to our office  Son Janya Surti [phone]

## 2018-06-26 NOTE — Progress Notes (Signed)
   Subjective:    Patient ID: Erin Baldwin, female    DOB: [birth year], 85 y.o.   MRN: [id number]   CC: Elevated blood pressure  HPI: Patient is a 85 year old female presents today for elevated blood pressure ophthalmologist office.  Patient was sent from ophthalmologist office this morning prior to procedure for macular degeneration after blood pressure check showed a reading of 246/114.  Blood pressure was checked manually and was still elevated.  Patient was asymptomatic with no complaint of chest pain, shortness of breath, headache, dizziness, vision change.  Patient is currently on atenolol 50 mg 2 times a day.  Patient has not had any issues with blood pressure in the past year.  Last office visit blood pressure was 122/78.  Patient has been unable to do her regular exercise regimen since confinement due to COVID.  Son also endorses to have been under a lot of stress given that daughter is in [place] nursing facility.  Smoking status reviewed   ROS: all other systems were reviewed and are negative other than in the HPI   Past Medical History:  Diagnosis Date  . Arthritis   . Hearing decreased   . Hyperlipidemia   . Hypertension   . Urinary incontinence     Past Surgical History:  Procedure Laterality Date  . ANAL FISSURE REPAIR    . HEMORRHOID SURGERY    . HIP ARTHROPLASTY     left 2006, right 2013  . KNEE ARTHROPLASTY     right 2015    Past medical history, surgical, family, and social history reviewed and updated in the EMR as appropriate.  Objective:  BP (!) 180/80 Comment: LT arm: 180/80  Pulse (!) 57   Temp 98.2 F (36.8 C) (Oral)   Wt 156 lb (70.8 kg)   SpO2 96%   BMI 32.60 kg/m   Vitals and nursing note reviewed  General: NAD, pleasant, able to participate in exam Cardiac: RRR, normal heart sounds, no murmurs. 2+ radial and PT pulses bilaterally Respiratory: CTAB, normal effort, No wheezes, rales or rhonchi Abdomen: soft, nontender, nondistended, no  hepatic or splenomegaly, +BS Extremities: no edema or cyanosis. WWP. Skin: warm and dry, no rashes noted Neuro: alert and oriented x4, no focal deficits Psych: Normal affect and mood   Assessment & Plan:   Essential hypertension, benign Blood pressure readings First attempt: Left 172/88, right 176/82 Second attempt: Left 180/80, right 180/80 Heart rate: 57 Blood pressure readings are elevated in comparison to last readings in October when pressure were normal.  Suspect elevation is secondary to decreased exercise and increased stress.  Patient is asymptomatic and will need further control.  Also patient will concentrate on blocker, she is scheduled for nurse clinic for blood pressure check early next week.  Given age not be aggressive in trying to control blood pressure. --Start amlodipine 5 mg daily --Continue atenolol 50 mg twice daily --Continue to work on exercise and diet --Follow-up with PCP as needed --No blood work today.  Results from last office visit were within normal limit and patient is currently asymptomatic.    Lovena Neighbours, MD [facility] PGY-3

## 2018-06-26 NOTE — Assessment & Plan Note (Signed)
Blood pressure readings First attempt: Left 172/88, right 176/82 Second attempt: Left 180/80, right 180/80 Heart rate: 57 Blood pressure readings are elevated in comparison to last readings in October when pressure were normal.  Suspect elevation is secondary to decreased exercise and increased stress.  Patient is asymptomatic and will need further control.  Also patient will concentrate on blocker, she is scheduled for nurse clinic for blood pressure check early next week.  Given age not be aggressive in trying to control blood pressure. --Start amlodipine 5 mg daily --Continue atenolol 50 mg twice daily --Continue to work on exercise and diet --Follow-up with PCP as needed --No blood work today.  Results from last office visit were within normal limit and patient is currently asymptomatic.

## 2018-06-26 NOTE — Patient Instructions (Signed)
Managing Your Hypertension  Hypertension is commonly called high blood pressure. This is when the force of your blood pressing against the walls of your arteries is too strong. Arteries are blood vessels that carry blood from your heart throughout your body. Hypertension forces the heart to work harder to pump blood, and may cause the arteries to become narrow or stiff. Having untreated or uncontrolled hypertension can cause heart attack, stroke, kidney disease, and other problems.  What are blood pressure readings?  A blood pressure reading consists of a higher number over a lower number. Ideally, your blood pressure should be below 120/80. The first ("top") number is called the systolic pressure. It is a measure of the pressure in your arteries as your heart beats. The second ("bottom") number is called the diastolic pressure. It is a measure of the pressure in your arteries as the heart relaxes.  What does my blood pressure reading mean?  Blood pressure is classified into four stages. Based on your blood pressure reading, your health care provider may use the following stages to determine what type of treatment you need, if any. Systolic pressure and diastolic pressure are measured in a unit called mm Hg.  Normal   Systolic pressure: below 120.   Diastolic pressure: below 80.  Elevated   Systolic pressure: 120-129.   Diastolic pressure: below 80.  Hypertension stage 1   Systolic pressure: 130-139.   Diastolic pressure: 80-89.  Hypertension stage 2   Systolic pressure: 140 or above.   Diastolic pressure: 90 or above.  What health risks are associated with hypertension?  Managing your hypertension is an important responsibility. Uncontrolled hypertension can lead to:   A heart attack.   A stroke.   A weakened blood vessel (aneurysm).   Heart failure.   Kidney damage.   Eye damage.   Metabolic syndrome.   Memory and concentration problems.  What changes can I make to manage my  hypertension?  Hypertension can be managed by making lifestyle changes and possibly by taking medicines. Your health care provider will help you make a plan to bring your blood pressure within a normal range.  Eating and drinking     Eat a diet that is high in fiber and potassium, and low in salt (sodium), added sugar, and fat. An example eating plan is called the DASH (Dietary Approaches to Stop Hypertension) diet. To eat this way:  ? Eat plenty of fresh fruits and vegetables. Try to fill half of your plate at each meal with fruits and vegetables.  ? Eat whole grains, such as whole wheat pasta, brown rice, or whole grain bread. Fill about one quarter of your plate with whole grains.  ? Eat low-fat diary products.  ? Avoid fatty cuts of meat, processed or cured meats, and poultry with skin. Fill about one quarter of your plate with lean proteins such as fish, chicken without skin, beans, eggs, and tofu.  ? Avoid premade and processed foods. These tend to be higher in sodium, added sugar, and fat.   Reduce your daily sodium intake. Most people with hypertension should eat less than 1,500 mg of sodium a day.   Limit alcohol intake to no more than 1 drink a day for nonpregnant women and 2 drinks a day for men. One drink equals 12 oz of beer, 5 oz of wine, or 1 oz of hard liquor.  Lifestyle   Work with your health care provider to maintain a healthy body weight, or to lose   weight. Ask what an ideal weight is for you.   Get at least 30 minutes of exercise that causes your heart to beat faster (aerobic exercise) most days of the week. Activities may include walking, swimming, or biking.   Include exercise to strengthen your muscles (resistance exercise), such as weight lifting, as part of your weekly exercise routine. Try to do these types of exercises for 30 minutes at least 3 days a week.   Do not use any products that contain nicotine or tobacco, such as cigarettes and e-cigarettes. If you need help quitting,  ask your health care provider.   Control any long-term (chronic) conditions you have, such as high cholesterol or diabetes.  Monitoring   Monitor your blood pressure at home as told by your health care provider. Your personal target blood pressure may vary depending on your medical conditions, your age, and other factors.   Have your blood pressure checked regularly, as often as told by your health care provider.  Working with your health care provider   Review all the medicines you take with your health care provider because there may be side effects or interactions.   Talk with your health care provider about your diet, exercise habits, and other lifestyle factors that may be contributing to hypertension.   Visit your health care provider regularly. Your health care provider can help you create and adjust your plan for managing hypertension.  Will I need medicine to control my blood pressure?  Your health care provider may prescribe medicine if lifestyle changes are not enough to get your blood pressure under control, and if:   Your systolic blood pressure is 130 or higher.   Your diastolic blood pressure is 80 or higher.  Take medicines only as told by your health care provider. Follow the directions carefully. Blood pressure medicines must be taken as prescribed. The medicine does not work as well when you skip doses. Skipping doses also puts you at risk for problems.  Contact a health care provider if:   You think you are having a reaction to medicines you have taken.   You have repeated (recurrent) headaches.   You feel dizzy.   You have swelling in your ankles.   You have trouble with your vision.  Get help right away if:   You develop a severe headache or confusion.   You have unusual weakness or numbness, or you feel faint.   You have severe pain in your chest or abdomen.   You vomit repeatedly.   You have trouble breathing.  Summary   Hypertension is when the force of blood pumping  through your arteries is too strong. If this condition is not controlled, it may put you at risk for serious complications.   Your personal target blood pressure may vary depending on your medical conditions, your age, and other factors. For most people, a normal blood pressure is less than 120/80.   Hypertension is managed by lifestyle changes, medicines, or both. Lifestyle changes include weight loss, eating a healthy, low-sodium diet, exercising more, and limiting alcohol.  This information is not intended to replace advice given to you by your health care provider. Make sure you discuss any questions you have with your health care provider.  Document Released: 11/21/2011 Document Revised: 01/25/2016 Document Reviewed: 01/25/2016  Elsevier Interactive Patient Education  2019 [employer]

## 2018-06-26 NOTE — Telephone Encounter (Signed)
Done. Jelicia Nantz, CMA  

## 2018-06-30 NOTE — Progress Notes (Addendum)
[facility] Note  07/02/2018     CHIEF COMPLAINT Patient presents for Retina Follow Up   HISTORY OF PRESENT ILLNESS: Erin Baldwin is a 85 y.o. female who presents to the clinic today for:   HPI    Retina Follow Up    Patient presents with  Other (Mac heme).  In right eye.  Severity is severe.  Duration of 6 days.  Since onset it is stable.  I, the attending physician,  performed the HPI with the patient and updated documentation appropriately.          Comments    Patient states vision still blurred OD, no worse and no better. BP was 140/80 yesterday am. Started new blood pressure medication on Monday--added amlodipine 70m once daily in addition to atenolol 50 mg bid.       Last edited by ZBernarda Caffey MD on 07/02/2018 10:43 AM. (History)    pt is here for an injection in her right eye today  Referring physician: MLeeanne Rio [facility] [place] [id number] HISTORICAL INFORMATION:   Selected notes from the MEDICAL RECORD NUMBER Referred by AShirleen SchirmerPA-C for exu ARMD  LEE: 04.13.20 (A. Lundquist) [BCVA: OD: CF_0  OS: 20/30] Ocular Hx-exu ARMD OD, non-exu ARMD OS, NS OU, HTN ret OU, DES OU PMH-HTN, arthritis, HOH    CURRENT MEDICATIONS: No current outpatient medications on file. (Ophthalmic Drugs)   No current facility-administered medications for this visit.  (Ophthalmic Drugs)   Current Outpatient Medications (Other)  Medication Sig  . acetaminophen (TYLENOL) 500 MG tablet Take 500 mg by mouth every 6 (six) hours as needed.  .Marland KitchenamLODipine (NORVASC) 5 MG tablet TAKE 1 TABLET BY MOUTH EVERY DAY  . atenolol (TENORMIN) 50 MG tablet TAKE 1 TABLET(50 MG) BY MOUTH TWICE DAILY  . cetirizine (ZYRTEC) 5 MG tablet Take 1 tablet (5 mg total) by mouth daily.  .Marland Kitchenoxybutynin (DITROPAN) 5 MG tablet Take 1 tablet (5 mg total) by mouth 2 (two) times daily. (Patient taking differently: Take 5 mg by mouth daily. )   No  current facility-administered medications for this visit.  (Other)      REVIEW OF SYSTEMS: ROS    Positive for: Eyes   Negative for: Constitutional, Gastrointestinal, Neurological, Skin, Genitourinary, Musculoskeletal, HENT, Endocrine, Cardiovascular, Respiratory, Psychiatric, Allergic/Imm, Heme/Lymph   Last edited by BRoselee NovaD on 07/02/2018 10:15 AM. (History)       ALLERGIES Allergies  Allergen Reactions  . Penicillins Hives    PAST MEDICAL HISTORY Past Medical History:  Diagnosis Date  . Arthritis   . Hearing decreased   . Hyperlipidemia   . Hypertension   . Urinary incontinence    Past Surgical History:  Procedure Laterality Date  . ANAL FISSURE REPAIR    . HEMORRHOID SURGERY    . HIP ARTHROPLASTY     left 2006, right 2013  . KNEE ARTHROPLASTY     right 2015    FAMILY HISTORY Family History  Problem Relation Age of Onset  . Breast cancer Sister 495      died age 83 . Esophageal cancer Mother        died in 719's . Multiple sclerosis Daughter   . Diabetes Daughter   . Hypertension Daughter   . Glaucoma Son     SOCIAL HISTORY Social History   Tobacco Use  . Smoking status: Never Smoker  . Smokeless tobacco: Never Used  Substance Use Topics  . Alcohol use: Yes    Alcohol/week: 0.0 standard drinks    Comment: occaional  . Drug use: Never       Today's Vitals   07/02/18 1042 07/02/18 1117  BP: (!) 181/80 (!) 180/84  Pulse: 73       OPHTHALMIC EXAM:  Base Eye Exam    Visual Acuity (Snellen - Linear)      Right Left   Dist [place] 20/300 20/50 +2   Dist ph  20/200 -3 20/25 -2       Tonometry (Tonopen, 10:31 AM)      Right Left   Pressure 17 16       Pupils      Dark Light Shape React APD   Right 5 4.5 Round Slow None   Left 5 4 Round Brisk None       Visual Fields (Counting fingers)      Left Right    Full    Restrictions  Central scotoma  Small central scotoma not to scale        Extraocular Movement      Right  Left    Full, Ortho Full, Ortho       Neuro/Psych    Oriented x3:  Yes   Mood/Affect:  Normal       Dilation    Right eye:  1.0% Mydriacyl, 2.5% Phenylephrine @ 10:31 AM        Slit Lamp and Fundus Exam    Slit Lamp Exam      Right Left   Lids/Lashes Dermatochalasis - upper lid Dermatochalasis - upper lid   Conjunctiva/Sclera White and quiet White and quiet   Cornea Arcus, 1+ Punctate epithelial erosions Arcus, 1+ Punctate epithelial erosions   Anterior Chamber Deep and quiet Deep and quiet   Iris Round and dilated Round and dilated   Lens 2+ Nuclear sclerosis, 3+ Cortical cataract 2+ Nuclear sclerosis, 3+ Cortical cataract   Vitreous Posterior vitreous detachment, Vitreous syneresis Posterior vitreous detachment, Vitreous syneresis       Fundus Exam      Right Left   Disc mildly Tilted disc, Pink and Sharp, mild Peripapillary atrophy    C/D Ratio 0.3 0.4   Macula large intraretinal heme centrally, large subretinal heme along ST arcades, macroaneurysm superior macula, scattered drusen    Vessels mild Vascular attenuation, venules dilated and tortuous, +macroaneurysm superior macula    Periphery Attached, peripheral drusen, reticular degeneration           IMAGING AND PROCEDURES  Imaging and Procedures for _0 @  Intravitreal Injection, Pharmacologic Agent - OD - Right Eye       Time Out 07/02/2018. 11:27 AM. Confirmed correct patient, procedure, site, and patient consented.   Anesthesia Topical anesthesia was used. Anesthetic medications included Lidocaine 2%, Proparacaine 0.5%.   Procedure Preparation included 5% betadine to ocular surface, eyelid speculum. A supplied needle was used.   Injection:  1.25 mg Bevacizumab (AVASTIN) SOLN   NDC: 86578-469-62, Lot: [id number] , Expiration date: 07/30/2018   Route: Intravitreal, Site: Right Eye, Waste: 0 mL  Post-op Post injection exam found visual acuity of at least counting fingers. The patient tolerated the  procedure well. There were no complications. The patient received written and verbal post procedure care education.                 ASSESSMENT/PLAN:    ICD-10-CM   1. Macular hemorrhage, right H35.61 Intravitreal Injection, Pharmacologic Agent - OD -  Right Eye    Bevacizumab (AVASTIN) SOLN 1.25 mg  2. 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 Exudative age-related macular degeneration of right eye with active choroidal neovascularization (HCC) H35.3211 Intravitreal Injection, Pharmacologic Agent - OD - Right Eye    Bevacizumab (AVASTIN) SOLN 1.25 mg  4. Retinal edema H35.81 CANCELED: OCT, Retina - OU - Both Eyes  5. Intermediate stage nonexudative age-related macular degeneration of left eye H35.3122   6. Essential hypertension I10   7. Hypertensive retinopathy of both eyes H35.033   8. Combined forms of age-related cataract of both eyes H25.813     1-4. Macular hemorrhage OD -- intraretinal and subretinal central hemorrhage  - likely retinal macroaneurysm +/- exudative age related macular degeneration component, OD    - OCT shows intraretinal and subretinal hyperreflective material OD  - BCVA 20/200-3 OD  - discussed findings and guarded prognosis  - The incidence pathology and anatomy of retinal macroaneurysm and wet AMD discussed   - discussed treatment options including observation vs intravitreal anti-VEGF agents such as Avastin, Lucentis, Eylea.    - Risks of endophthalmitis and vascular occlusive events and atrophic changes discussed with patient  - recommend IVA OD #1 today, 04.22.2020  - BP today 181/80  - pt wishes to proceed with treatment  - RBA of procedure discussed, questions answered  - informed consent obtained and signed  - see procedure note - f/u 4 weeks  5. Age related macular degeneration, non-exudative, OS  - The incidence, anatomy, and pathology of  dry AMD, risk of progression, and the AREDS and AREDS 2 study including smoking risks discussed with patient.  - Recommend amsler grid monitoring  6,7. Hypertensive retinopathy OU  - BP severely elevated on multiple separate measurements in office, but pt denies headache, numbness, tingling or any other symptoms related to BP Today's Vitals   07/02/18 1042 07/02/18 1117  BP: (!) 181/80 (!) 180/84  Pulse: 73    - discussed importance of tight BP control and likely contribution of BP to retinal pathology  - recommended consultation with PCP for further evaluation and management of hypertension -- BP meds have been adjusted and BP readings improved today  - monitor  8. Mixed form age related cataract OU  - The symptoms of cataract, surgical options, and treatments and risks were discussed with patient.  - discussed diagnosis and progression  - not yet visually significant  - monitor for now    Ophthalmic Meds Ordered this visit:  Meds ordered this encounter  Medications  . Bevacizumab (AVASTIN) SOLN 1.25 mg       Return in about 4 weeks (around 07/30/2018) for Dilated Exam, OCT, Possible Injxn.  There are no Patient Instructions on file for this visit.   Explained the diagnoses, plan, and follow up with the patient and they expressed understanding.  Patient expressed understanding of the importance of proper follow up care.   This document serves as a record of services personally performed by Gardiner Sleeper, MD, PhD. It was created on their behalf by Ernest Mallick, OA, an ophthalmic assistant. The creation of this record is the provider's dictation and/or activities during the visit.    Electronically signed by: Ernest Mallick, OA  04.20.2020 11:54 AM     Gardiner Sleeper, M.D., Ph.D. Diseases & Surgery of the Retina and Vitreous [facility]  I have  reviewed the above documentation for accuracy and completeness, and I agree with the above. Gardiner Sleeper, M.D., Ph.D. 07/02/18 11:54 AM    Abbreviations: M myopia (nearsighted); A astigmatism; H hyperopia (farsighted); P presbyopia; Mrx spectacle prescription;  CTL contact lenses; OD right eye; OS left eye; OU both eyes  XT exotropia; ET esotropia; PEK punctate epithelial keratitis; PEE punctate epithelial erosions; DES dry eye syndrome; MGD meibomian gland dysfunction; ATs artificial tears; PFAT's preservative free artificial tears; [place] nuclear sclerotic cataract; PSC posterior subcapsular cataract; ERM epi-retinal membrane; PVD posterior vitreous detachment; RD retinal detachment; DM diabetes mellitus; DR diabetic retinopathy; NPDR non-proliferative diabetic retinopathy; PDR proliferative diabetic retinopathy; CSME clinically significant macular edema; DME diabetic macular edema; dbh dot blot hemorrhages; CWS cotton wool spot; POAG primary open angle glaucoma; C/D cup-to-disc ratio; HVF humphrey visual field; GVF goldmann visual field; OCT optical coherence tomography; IOP intraocular pressure; BRVO Branch retinal vein occlusion; CRVO central retinal vein occlusion; CRAO central retinal artery occlusion; BRAO branch retinal artery occlusion; RT retinal tear; SB scleral buckle; PPV pars plana vitrectomy; VH Vitreous hemorrhage; PRP panretinal laser photocoagulation; IVK intravitreal kenalog; VMT vitreomacular traction; MH Macular hole;  NVD neovascularization of the disc; NVE neovascularization elsewhere; AREDS age related eye disease study; ARMD age related macular degeneration; POAG primary open angle glaucoma; EBMD epithelial/anterior basement membrane dystrophy; ACIOL anterior chamber intraocular lens; IOL intraocular lens; PCIOL posterior chamber intraocular lens; Phaco/IOL phacoemulsification with intraocular lens placement; [place] photorefractive keratectomy; LASIK laser assisted in situ keratomileusis; HTN hypertension; DM diabetes mellitus; COPD chronic obstructive pulmonary disease

## 2018-07-01 ENCOUNTER — Other Ambulatory Visit: Payer: Self-pay

## 2018-07-01 ENCOUNTER — Ambulatory Visit (INDEPENDENT_AMBULATORY_CARE_PROVIDER_SITE_OTHER): Payer: Medicare Other | Admitting: *Deleted

## 2018-07-01 VITALS — BP 140/80

## 2018-07-01 DIAGNOSIS — I10 Essential (primary) hypertension: Secondary | ICD-10-CM

## 2018-07-01 NOTE — Patient Instructions (Signed)
Your BP was 140/80 today.  This is great! Please continue to take your new BP medication.

## 2018-07-01 NOTE — Progress Notes (Signed)
Patient here today for BP check after adding amlodapine 5mg  to regimen.    BP today is 140/80.  Checked BP in left arm with normal cuff.  Symptoms present: none.  Patient last took BP med 8am today.  Routed note to PCP.    Jone Baseman, CMA

## 2018-07-01 NOTE — Progress Notes (Signed)
Shanda Bumps,  Thank you for keeping me posted. Please inform patient readings are good we are going to continue the current regimen she is on with no change.  Lovena Neighbours, MD [facility], PGY-3

## 2018-07-02 ENCOUNTER — Ambulatory Visit (INDEPENDENT_AMBULATORY_CARE_PROVIDER_SITE_OTHER): Payer: Medicare Other | Admitting: Ophthalmology

## 2018-07-02 ENCOUNTER — Encounter (INDEPENDENT_AMBULATORY_CARE_PROVIDER_SITE_OTHER): Payer: Self-pay | Admitting: Ophthalmology

## 2018-07-02 VITALS — BP 180/84 | HR 73

## 2018-07-02 DIAGNOSIS — H3561 Retinal hemorrhage, right eye: Secondary | ICD-10-CM

## 2018-07-02 DIAGNOSIS — H353122 Nonexudative age-related macular degeneration, left eye, intermediate dry stage: Secondary | ICD-10-CM

## 2018-07-02 DIAGNOSIS — H3581 Retinal edema: Secondary | ICD-10-CM

## 2018-07-02 DIAGNOSIS — H25813 Combined forms of age-related cataract, bilateral: Secondary | ICD-10-CM

## 2018-07-02 DIAGNOSIS — H35011 Changes in retinal vascular appearance, right eye: Secondary | ICD-10-CM

## 2018-07-02 DIAGNOSIS — H35033 Hypertensive retinopathy, bilateral: Secondary | ICD-10-CM

## 2018-07-02 DIAGNOSIS — I10 Essential (primary) hypertension: Secondary | ICD-10-CM

## 2018-07-02 DIAGNOSIS — H353211 Exudative age-related macular degeneration, right eye, with active choroidal neovascularization: Secondary | ICD-10-CM

## 2018-07-02 MED ORDER — BEVACIZUMAB CHEMO INJECTION 1.25MG/0.05ML SYRINGE FOR KALEIDOSCOPE
1.2500 mg | INTRAVITREAL | Status: AC | PRN
  Administered 2018-07-02: 11:00:00 1.25 mg via INTRAVITREAL

## 2018-07-08 ENCOUNTER — Telehealth (INDEPENDENT_AMBULATORY_CARE_PROVIDER_SITE_OTHER): Payer: Self-pay

## 2018-07-22 ENCOUNTER — Other Ambulatory Visit: Payer: Self-pay

## 2018-07-22 ENCOUNTER — Ambulatory Visit: Payer: Medicare Other

## 2018-07-22 DIAGNOSIS — I10 Essential (primary) hypertension: Secondary | ICD-10-CM

## 2018-07-22 NOTE — Progress Notes (Signed)
Patient presents in nurse clinic with son to have blood pressure checked. Pts son stated recently her BP has been dropping in the mid 60s diastolic. Pt did take Atenolol this morning, however did not take amlodipine, last dose was yesterday. Pts blood pressure today, 138/69 checked with large cuff in right arm, pt had been sitting for ~60miuntes. If patient needs to continue amlodipine she will need a refill. Symptoms present, none.

## 2018-07-23 ENCOUNTER — Ambulatory Visit: Payer: Medicare Other

## 2018-07-23 ENCOUNTER — Telehealth: Payer: Self-pay

## 2018-07-23 MED ORDER — AMLODIPINE BESYLATE 5 MG PO TABS: 5.0000 mg | ORAL_TABLET | Freq: Every day | ORAL | 1 refills | Status: DC

## 2018-07-23 NOTE — Progress Notes (Signed)
Patient should continue current medication. Sent in refill of amlodipine for her Thanks Latrelle Dodrill, MD

## 2018-07-23 NOTE — Telephone Encounter (Signed)
That all sounds fine. They can self-monitor BP at home. If persistently above 140/90 they should give [place] a call to discuss over the phone.  Thanks, Latrelle Dodrill, MD

## 2018-07-23 NOTE — Addendum Note (Signed)
Addended by: Latrelle Dodrill on: 07/23/2018 02:59 PM   Modules accepted: Orders

## 2018-07-23 NOTE — Telephone Encounter (Signed)
Spoke to patient's son Loistine Chance and was told that patient no longer wants to take AmLodipine because she says it makes her feel dizzy and light headed.  According to Loistine Chance, patient was only put on AmLodipine because of her eye surgery to bring her pressure down.  Loistine Chance states that patient no longer feels that it is necessary. Patient is still taking atenolol and would like to continue taking it alone.    Patient took herself off of AmLodipine for a few days to see if she felt better with no symptoms and the symptoms resolved.  Loistine Chance states that he will not pick up the AmLodipine at this time but will monitor and keep a log of blood pressures at home.  He will also call clinic to make nurse visit appointment if needed.  Please advise.  Glennie Hawk, CMA

## 2018-07-31 NOTE — Progress Notes (Signed)
[facility] Note  08/01/2018     CHIEF COMPLAINT Patient presents for Retina Follow Up   HISTORY OF PRESENT ILLNESS: Erin Baldwin is a 85 y.o. female who presents to the clinic today for:   HPI    Retina Follow Up    Patient presents with  Other (Macular heme).  In right eye.  Severity is severe.  Duration of 4 weeks.  Since onset it is gradually improving.  I, the attending physician,  performed the HPI with the patient and updated documentation appropriately.          Comments    Patient states vision improving OD.       Last edited by Bernarda Caffey, MD on 08/01/2018 10:26 AM. (History)    pt is with her son today, he states her blood pressure has been better, he states she is on an extra bp pill, pt states about a week and a half after the injection she saw a bunch of new floaters, she states it only last for about half the day and then cleared up, she states for about 2 days afterwards her eye was sensitive, pts son states the extra medication is making her feel light headed and dizzy, pt is taking AREDS 2  Referring physician: Leeanne Rio, [place], [place] [address]  HISTORICAL INFORMATION:   Selected notes from the [facility] Referred by Shirleen Schirmer PA-C for exu ARMD  LEE: 04.13.20 (A. Lundquist) [BCVA: OD: CF'@3ft'$  OS: 20/30] Ocular Hx-exu ARMD OD, non-exu ARMD OS, NS OU, HTN ret OU, DES OU PMH-HTN, arthritis, HOH    CURRENT MEDICATIONS: No current outpatient medications on file. (Ophthalmic Drugs)   No current facility-administered medications for this visit.  (Ophthalmic Drugs)   Current Outpatient Medications (Other)  Medication Sig  . acetaminophen (TYLENOL) 500 MG tablet Take 500 mg by mouth every 6 (six) hours as needed.  Marland Kitchen amLODipine (NORVASC) 5 MG tablet Take 1 tablet (5 mg total) by mouth daily.  Marland Kitchen atenolol (TENORMIN) 50 MG tablet TAKE 1 TABLET(50 MG) BY MOUTH TWICE DAILY  .  cetirizine (ZYRTEC) 5 MG tablet Take 1 tablet (5 mg total) by mouth daily.  Marland Kitchen oxybutynin (DITROPAN) 5 MG tablet Take 1 tablet (5 mg total) by mouth 2 (two) times daily. (Patient taking differently: Take 5 mg by mouth daily. )   No current facility-administered medications for this visit.  (Other)      REVIEW OF SYSTEMS: ROS    Positive for: Eyes   Negative for: Constitutional, Gastrointestinal, Neurological, Skin, Genitourinary, Musculoskeletal, HENT, Endocrine, Cardiovascular, Respiratory, Psychiatric, Allergic/Imm, Heme/Lymph   Last edited by Roselee Nova D on 08/01/2018  9:40 AM. (History)       ALLERGIES Allergies  Allergen Reactions  . Penicillins Hives    PAST MEDICAL HISTORY Past Medical History:  Diagnosis Date  . Arthritis   . Hearing decreased   . Hyperlipidemia   . Hypertension   . Urinary incontinence    Past Surgical History:  Procedure Laterality Date  . ANAL FISSURE REPAIR    . HEMORRHOID SURGERY    . HIP ARTHROPLASTY     left 2006, right 2013  . KNEE ARTHROPLASTY     right 2015    FAMILY HISTORY Family History  Problem Relation Age of Onset  . Breast cancer Sister 91       died age 2  . Esophageal cancer Mother  died in 77's  . Multiple sclerosis Daughter   . Diabetes Daughter   . Hypertension Daughter   . Glaucoma Son     SOCIAL HISTORY Social History   Tobacco Use  . Smoking status: Never Smoker  . Smokeless tobacco: Never Used  Substance Use Topics  . Alcohol use: Yes    Alcohol/week: 0.0 standard drinks    Comment: occaional  . Drug use: Never       Today's Vitals   08/01/18 1020  BP: (!) 169/84  Pulse: 78      OPHTHALMIC EXAM:  Base Eye Exam    Visual Acuity (Snellen - Linear)      Right Left   Dist [place] 20/150 -2 20/40 +2   Dist ph [place] NI 20/30 -2       Tonometry (Tonopen, 9:54 AM)      Right Left   Pressure 18 17       Pupils      Dark Light Shape React APD   Right 5 4.5 Round Slow None   Left 5 4  Round Slow None       Visual Fields (Counting fingers)      Left Right    Full Full  VF defect noted previously OD, none today       Extraocular Movement      Right Left    Full, Ortho Full, Ortho       Neuro/Psych    Oriented x3:  Yes   Mood/Affect:  Normal       Dilation    Both eyes:  1.0% Mydriacyl, 2.5% Phenylephrine @ 9:55 AM        Slit Lamp and Fundus Exam    Slit Lamp Exam      Right Left   Lids/Lashes Dermatochalasis - upper lid Dermatochalasis - upper lid   Conjunctiva/Sclera White and quiet White and quiet   Cornea Arcus, 1+ Punctate epithelial erosions Arcus, 1+ Punctate epithelial erosions   Anterior Chamber Deep and quiet Deep and quiet   Iris Round and dilated Round and dilated   Lens 2+ Nuclear sclerosis, 3+ Cortical cataract 2+ Nuclear sclerosis, 3+ Cortical cataract   Vitreous Posterior vitreous detachment, Vitreous syneresis Posterior vitreous detachment, Vitreous syneresis       Fundus Exam      Right Left   Disc mildly Tilted disc, Pink and Sharp, mild Peripapillary atrophy mild tilt, Pink and Sharp, mild Peripapillary atrophy   C/D Ratio 0.3 0.4   Macula large intraretinal heme centrally - improving with center turning white, large subretinal heme along ST arcades - improving, turning dark in color, macroaneurysm superior macula, scattered drusen, improved edema, scattered patches of residual red SRH flat, Blunted foveal reflex, Drusen, RPE mottling and clumping, No heme or edema   Vessels mild Vascular attenuation, venules dilated and tortuous, +macroaneurysm superior macula Vascular attenuation, mild Tortuousity   Periphery Attached, peripheral drusen, reticular degeneration Attached, peripheral drusen, reticular degeneration        Refraction    Manifest Refraction (Retinoscopy)      Sphere Cylinder Axis Dist VA   Right -0.25 +0.25 015 20/150-2   Left +0.50 +0.50 160 20/30+1          IMAGING AND PROCEDURES  Imaging and Procedures for  '@TODAY'$ @  OCT, Retina - OU - Both Eyes       Right Eye Quality was good. Central Foveal Thickness: 436. Progression has improved. Findings include subretinal fluid, intraretinal fluid, intraretinal hyper-reflective material,  subretinal hyper-reflective material, outer retinal atrophy, abnormal foveal contour, retinal drusen  (Inter-retinal v pre-retinal heme - improved, +sub-retinal heme - improved).   Left Eye Quality was good. Central Foveal Thickness: 243. Progression has been stable. Findings include normal foveal contour, no SRF, no IRF, retinal drusen , outer retinal atrophy (Partial PVD).   Notes *Images captured and stored on drive  Diagnosis / Impression:  OD: intraretinal and subretinal heme hyperreflective material consistent with [facility] and SRH -- retinal macroaneurysm vs exudative ARMD -- improving OS: non-exu ARMD   Clinical management:  See below  Abbreviations: NFP - Normal foveal profile. CME - cystoid macular edema. PED - pigment epithelial detachment. IRF - intraretinal fluid. SRF - subretinal fluid. EZ - ellipsoid zone. ERM - epiretinal membrane. ORA - outer retinal atrophy. ORT - outer retinal tubulation. SRHM - subretinal hyper-reflective material        Intravitreal Injection, Pharmacologic Agent - OD - Right Eye       Time Out 08/01/2018. 10:40 AM. Confirmed correct patient, procedure, site, and patient consented.   Anesthesia Topical anesthesia was used. Anesthetic medications included Lidocaine 2%, Proparacaine 0.5%.   Procedure Preparation included 5% betadine to ocular surface, eyelid speculum. A 30 gauge needle was used.   Injection:  1.25 mg Bevacizumab (AVASTIN) SOLN   NDC: 54008-676-19, Lot: [id number]'@13'$ , Expiration date: 08/22/2018   Route: Intravitreal, Site: Right Eye, Waste: 0 mL  Post-op Post injection exam found visual acuity of at least counting fingers. The patient tolerated the procedure well. There were no complications. The patient  received written and verbal post procedure care education.                 ASSESSMENT/PLAN:    ICD-10-CM   1. Macular hemorrhage, right H35.61 Intravitreal Injection, Pharmacologic Agent - OD - Right Eye    Bevacizumab (AVASTIN) SOLN 1.25 mg  2. 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 Exudative age-related macular degeneration of right eye with active choroidal neovascularization (HCC) H35.3211 Intravitreal Injection, Pharmacologic Agent - OD - Right Eye    Bevacizumab (AVASTIN) SOLN 1.25 mg  4. Retinal edema H35.81 OCT, Retina - OU - Both Eyes  5. Intermediate stage nonexudative age-related macular degeneration of left eye H35.3122   6. Essential hypertension I10   7. Hypertensive retinopathy of both eyes H35.033   8. Combined forms of age-related cataract of both eyes H25.813     1-4. Macular hemorrhage OD -- intraretinal and subretinal central hemorrhage  - likely retinal macroaneurysm +/- exudative age related macular degeneration component, OD    - OCT shows intraretinal and subretinal hyperreflective material OD -- both improving  - BCVA improved to 20/150 from 20/200-3 OD  - discussed findings and guarded prognosis  - The incidence pathology and anatomy of retinal macroaneurysm and wet AMD discussed   - discussed treatment options including observation vs intravitreal anti-VEGF agents such as Avastin, Lucentis, Eylea.    - Risks of endophthalmitis and vascular occlusive events and atrophic changes discussed with patient  - s/p IVA OD #1 (04.22.20)  - recommend IVA OD #2 today, 05.22.2020  - BP today 169/84  - pt wishes to proceed with treatment  - RBA of procedure discussed, questions answered  - informed consent obtained and signed  - see procedure note  - f/u 4 weeks  5. Age related macular degeneration, non-exudative, OS  - The incidence, anatomy,  and  pathology of dry AMD, risk of progression, and the AREDS and AREDS 2 study including smoking risks discussed with patient.  - Recommend amsler grid monitoring  6,7. Hypertensive retinopathy OU  - BP severely elevated on multiple separate measurements in office, but pt denies headache, numbness, tingling or any other symptoms related to BP Today's Vitals   08/01/18 1020  BP: (!) 169/84  Pulse: 78   - discussed importance of tight BP control and likely contribution of BP to retinal pathology  - recommended consultation with PCP for further evaluation and management of hypertension -- BP meds have been adjusted and BP readings improved today  - monitor  8. Mixed form age related cataract OU  - The symptoms of cataract, surgical options, and treatments and risks were discussed with patient.  - discussed diagnosis and progression  - not yet visually significant  - monitor for now    Ophthalmic Meds Ordered this visit:  Meds ordered this encounter  Medications  . Bevacizumab (AVASTIN) SOLN 1.25 mg       Return in about 5 weeks (around 09/05/2018) for f/u macular heme OD, DFE, OCT.  There are no Patient Instructions on file for this visit.   Explained the diagnoses, plan, and follow up with the patient and they expressed understanding.  Patient expressed understanding of the importance of proper follow up care.   This document serves as a record of services personally performed by Gardiner Sleeper, MD, PhD. It was created on their behalf by Ernest Mallick, OA, an ophthalmic assistant. The creation of this record is the provider's dictation and/or activities during the visit.    Electronically signed by: Ernest Mallick, OA  05.22.2020 1:32 AM    Gardiner Sleeper, M.D., Ph.D. Diseases & Surgery of the Retina and Vitreous [facility]  I have reviewed the above documentation for accuracy and completeness, and I agree with the above. Gardiner Sleeper, M.D., Ph.D.  08/02/18 1:33 AM     Abbreviations: M myopia (nearsighted); A astigmatism; H hyperopia (farsighted); P presbyopia; Mrx spectacle prescription;  CTL contact lenses; OD right eye; OS left eye; OU both eyes  XT exotropia; ET esotropia; PEK punctate epithelial keratitis; PEE punctate epithelial erosions; DES dry eye syndrome; MGD meibomian gland dysfunction; ATs artificial tears; PFAT's preservative free artificial tears; [place] nuclear sclerotic cataract; PSC posterior subcapsular cataract; ERM epi-retinal membrane; PVD posterior vitreous detachment; RD retinal detachment; DM diabetes mellitus; DR diabetic retinopathy; NPDR non-proliferative diabetic retinopathy; PDR proliferative diabetic retinopathy; CSME clinically significant macular edema; DME diabetic macular edema; dbh dot blot hemorrhages; CWS cotton wool spot; POAG primary open angle glaucoma; C/D cup-to-disc ratio; HVF humphrey visual field; GVF goldmann visual field; OCT optical coherence tomography; IOP intraocular pressure; BRVO Branch retinal vein occlusion; CRVO central retinal vein occlusion; CRAO central retinal artery occlusion; BRAO branch retinal artery occlusion; RT retinal tear; SB scleral buckle; PPV pars plana vitrectomy; VH Vitreous hemorrhage; PRP panretinal laser photocoagulation; IVK intravitreal kenalog; VMT vitreomacular traction; MH Macular hole;  NVD neovascularization of the disc; NVE neovascularization elsewhere; AREDS age related eye disease study; ARMD age related macular degeneration; POAG primary open angle glaucoma; EBMD epithelial/anterior basement membrane dystrophy; ACIOL anterior chamber intraocular lens; IOL intraocular lens; PCIOL posterior chamber intraocular lens; Phaco/IOL phacoemulsification with intraocular lens placement; [place] photorefractive keratectomy; LASIK laser assisted in situ keratomileusis; HTN hypertension; DM diabetes mellitus; COPD chronic obstructive pulmonary disease

## 2018-08-01 ENCOUNTER — Encounter (INDEPENDENT_AMBULATORY_CARE_PROVIDER_SITE_OTHER): Payer: Self-pay | Admitting: Ophthalmology

## 2018-08-01 ENCOUNTER — Ambulatory Visit (INDEPENDENT_AMBULATORY_CARE_PROVIDER_SITE_OTHER): Payer: Medicare Other | Admitting: Ophthalmology

## 2018-08-01 ENCOUNTER — Other Ambulatory Visit: Payer: Self-pay

## 2018-08-01 VITALS — BP 169/84 | HR 78

## 2018-08-01 DIAGNOSIS — I10 Essential (primary) hypertension: Secondary | ICD-10-CM

## 2018-08-01 DIAGNOSIS — H35011 Changes in retinal vascular appearance, right eye: Secondary | ICD-10-CM | POA: Diagnosis not present

## 2018-08-01 DIAGNOSIS — H25813 Combined forms of age-related cataract, bilateral: Secondary | ICD-10-CM | POA: Diagnosis not present

## 2018-08-01 DIAGNOSIS — H3561 Retinal hemorrhage, right eye: Secondary | ICD-10-CM

## 2018-08-01 DIAGNOSIS — H353122 Nonexudative age-related macular degeneration, left eye, intermediate dry stage: Secondary | ICD-10-CM

## 2018-08-01 DIAGNOSIS — H353211 Exudative age-related macular degeneration, right eye, with active choroidal neovascularization: Secondary | ICD-10-CM

## 2018-08-01 DIAGNOSIS — H3581 Retinal edema: Secondary | ICD-10-CM

## 2018-08-01 DIAGNOSIS — H35033 Hypertensive retinopathy, bilateral: Secondary | ICD-10-CM

## 2018-08-01 MED ORDER — BEVACIZUMAB CHEMO INJECTION 1.25MG/0.05ML SYRINGE FOR KALEIDOSCOPE
1.2500 mg | INTRAVITREAL | Status: AC | PRN
  Administered 2018-08-01: 12:00:00 1.25 mg via INTRAVITREAL

## 2018-09-03 NOTE — Progress Notes (Signed)
[facility] Note  09/05/2018     CHIEF COMPLAINT Patient presents for Retina Evaluation   HISTORY OF PRESENT ILLNESS: Erin Baldwin is a 85 y.o. female who presents to the clinic today for:   HPI    Retina Evaluation    In right eye.  This started weeks ago.  Duration of weeks.  Associated Symptoms Floaters.  Context:  distance vision.  Response to treatment was moderate improvement.  I, the attending physician,  performed the HPI with the patient and updated documentation appropriately.          Comments    Patient states her vision in her right eye continues to improve.  She denies eye pain or discomfort and denies any new or worsening floaters or fol.  BP today in office: 168/89 (left arm-sitting)       Last edited by Bernarda Caffey, MD on 09/05/2018 12:50 PM. (History)      Referring physician: Leeanne Rio, MD [place],  [place] [address]  HISTORICAL INFORMATION:   Selected notes from the MEDICAL RECORD NUMBER Referred by Shirleen Schirmer PA-C for exu ARMD  LEE: 04.13.20 (A. Lundquist) [BCVA: OD: CF'@3ft'$  OS: 20/30] Ocular Hx-exu ARMD OD, non-exu ARMD OS, NS OU, HTN ret OU, DES OU PMH-HTN, arthritis, HOH    CURRENT MEDICATIONS: No current outpatient medications on file. (Ophthalmic Drugs)   No current facility-administered medications for this visit.  (Ophthalmic Drugs)   Current Outpatient Medications (Other)  Medication Sig  . acetaminophen (TYLENOL) 500 MG tablet Take 500 mg by mouth every 6 (six) hours as needed.  Marland Kitchen amLODipine (NORVASC) 5 MG tablet Take 1 tablet (5 mg total) by mouth daily.  Marland Kitchen atenolol (TENORMIN) 50 MG tablet TAKE 1 TABLET(50 MG) BY MOUTH TWICE DAILY  . cetirizine (ZYRTEC) 5 MG tablet Take 1 tablet (5 mg total) by mouth daily.  Marland Kitchen oxybutynin (DITROPAN) 5 MG tablet Take 1 tablet (5 mg total) by mouth 2 (two) times daily. (Patient taking differently: Take 5 mg by mouth daily. )   No  current facility-administered medications for this visit.  (Other)      REVIEW OF SYSTEMS: ROS    Positive for: Eyes   Negative for: Constitutional, Gastrointestinal, Neurological, Skin, Genitourinary, Musculoskeletal, HENT, Endocrine, Cardiovascular, Respiratory, Psychiatric, Allergic/Imm, Heme/Lymph   Last edited by Doneen Poisson on 09/05/2018 10:04 AM. (History)       ALLERGIES Allergies  Allergen Reactions  . Penicillins Hives    PAST MEDICAL HISTORY Past Medical History:  Diagnosis Date  . Arthritis   . Hearing decreased   . Hyperlipidemia   . Hypertension   . Urinary incontinence    Past Surgical History:  Procedure Laterality Date  . ANAL FISSURE REPAIR    . HEMORRHOID SURGERY    . HIP ARTHROPLASTY     left 2006, right 2013  . KNEE ARTHROPLASTY     right 2015    FAMILY HISTORY Family History  Problem Relation Age of Onset  . Breast cancer Sister 80       died age 66  . Esophageal cancer Mother        died in 44's  . Multiple sclerosis Daughter   . Diabetes Daughter   . Hypertension Daughter   . Glaucoma Son     SOCIAL HISTORY Social History   Tobacco Use  . Smoking status: Never Smoker  . Smokeless tobacco: Never Used  Substance Use Topics  .  Alcohol use: Yes    Alcohol/week: 0.0 standard drinks    Comment: occaional  . Drug use: Never       Today's Vitals   09/05/18 1252  BP: (!) 168/89      OPHTHALMIC EXAM:  Base Eye Exam    Visual Acuity (Snellen - Linear)      Right Left   Dist [place] 20/80 +2 20/30 -2   Dist ph [place] NI NI       Tonometry (Tonopen, 10:11 AM)      Right Left   Pressure 14 17       Pupils      Dark Light Shape React APD   Right 4 3 Round Brisk 0   Left 4 3 Round Brisk 0       Extraocular Movement      Right Left    Full Full       Neuro/Psych    Oriented x3: Yes   Mood/Affect: Normal       Dilation    Both eyes: 1.0% Mydriacyl, 2.5% Phenylephrine @ 10:13 AM        Slit Lamp and Fundus  Exam    Slit Lamp Exam      Right Left   Lids/Lashes Dermatochalasis - upper lid Dermatochalasis - upper lid   Conjunctiva/Sclera White and quiet White and quiet   Cornea Arcus, 1+ Punctate epithelial erosions Arcus, 1+ Punctate epithelial erosions   Anterior Chamber Deep and quiet Deep and quiet   Iris Round and dilated Round and dilated   Lens 2+ Nuclear sclerosis, 3+ Cortical cataract 2+ Nuclear sclerosis, 3+ Cortical cataract   Vitreous Posterior vitreous detachment, Vitreous syneresis Posterior vitreous detachment, Vitreous syneresis       Fundus Exam      Right Left   Disc mildly Tilted disc, Pink and Sharp, mild Peripapillary atrophy mild tilt, Pink and Sharp, mild Peripapillary atrophy   C/D Ratio 0.3 0.4   Macula Interval resolution of sub-hyaloid heme, blunted foveal reflex, sub retinal hem-supertemp arcades, improving--turning white/grayish, RPE mottling   Blunted foveal reflex, Drusen, RPE mottling and clumping, No heme or edema   Vessels mild Vascular attenuation, venules dilated and tortuous, +macroaneurysm superior macula Vascular attenuation, mild Tortuousity   Periphery Attached, peripheral drusen, reticular degeneration Attached, peripheral drusen, reticular degeneration          IMAGING AND PROCEDURES  Imaging and Procedures for '@TODAY'$ @  OCT, Retina - OU - Both Eyes       Right Eye Quality was good. Central Foveal Thickness: 302. Progression has improved. Findings include subretinal fluid, subretinal hyper-reflective material, outer retinal atrophy, abnormal foveal contour, retinal drusen  (interval resolution of sub-hyalod heme, and interval improvement of sub-retinal heme).   Left Eye Quality was borderline. Central Foveal Thickness: 246. Progression has been stable. Findings include normal foveal contour, no SRF, no IRF, retinal drusen , outer retinal atrophy (Partial PVD).   Notes *Images captured and stored on drive  Diagnosis / Impression:  OD:  interval resolution of sub-hyalod heme, and interval improvement of sub-retinal heme OS: non-exu ARMD---stable   Clinical management:  See below  Abbreviations: NFP - Normal foveal profile. CME - cystoid macular edema. PED - pigment epithelial detachment. IRF - intraretinal fluid. SRF - subretinal fluid. EZ - ellipsoid zone. ERM - epiretinal membrane. ORA - outer retinal atrophy. ORT - outer retinal tubulation. SRHM - subretinal hyper-reflective material        Intravitreal Injection, Pharmacologic Agent - OD -  Right Eye       Time Out 09/05/2018. 10:55 AM. Confirmed correct patient, procedure, site, and patient consented.   Anesthesia Topical anesthesia was used. Anesthetic medications included Lidocaine 2%, Proparacaine 0.5%.   Procedure Preparation included 5% betadine to ocular surface, eyelid speculum. A supplied needle was used.   Injection:  1.25 mg Bevacizumab (AVASTIN) SOLN   NDC: 50569-794-80, Lot: [id number]'$ , Expiration date: 10/21/2020   Route: Intravitreal, Site: Right Eye, Waste: 0 mL  Post-op Post injection exam found visual acuity of at least counting fingers. The patient tolerated the procedure well. There were no complications. The patient received written and verbal post procedure care education.                 ASSESSMENT/PLAN:    ICD-10-CM   1. Macular hemorrhage, right  H35.61 Intravitreal Injection, Pharmacologic Agent - OD - Right Eye    Bevacizumab (AVASTIN) SOLN 1.25 mg  2. Retinal macroaneurysm of right eye  H35.011 Intravitreal Injection, Pharmacologic Agent - OD - Right Eye    Bevacizumab (AVASTIN) SOLN 1.25 mg  3. Exudative age-related macular degeneration of right eye with active choroidal neovascularization (HCC)  H35.3211 Intravitreal Injection, Pharmacologic Agent - OD - Right Eye    Bevacizumab (AVASTIN) SOLN 1.25 mg  4. Retinal edema  H35.81 OCT, Retina - OU - Both Eyes  5. 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 Essential hypertension  I10   7. Hypertensive retinopathy of both eyes  H35.033   8. Combined forms of age-related cataract of both eyes  H25.813     1-4. Macular hemorrhage OD -- intraretinal and subretinal central hemorrhage  - likely retinal macroaneurysm +/- exudative age related macular degeneration component--improving,OD    - OCT shows interval resolution of sub-hyalod heme, and interval improvement of sub-retinal heme  - BCVA improved to 20/80 from 20/150  - discussed findings and prognosis  - The incidence pathology and anatomy of retinal macroaneurysm and wet AMD discussed   - discussed treatment options including observation vs intravitreal anti-VEGF agents such as Avastin, Lucentis, Eylea.    - Risks of endophthalmitis and vascular occlusive events and atrophic changes discussed with patient  - s/p IVA OD #1 (04.22.20), #2 (05.22.20)  - recommend IVA OD #3 today, 06.26.2020  - BP today 168/89  - pt wishes to proceed with treatment  - RBA of procedure discussed, questions answered  - informed consent obtained and signed  - see procedure note  - f/u 4 weeks  5. Age related macular degeneration, non-exudative, OS  - The incidence, anatomy, and pathology of dry AMD, risk of progression, and the AREDS and AREDS 2 study including smoking risks discussed with patient.  - recommend amsler grid monitoring  6,7. Hypertensive retinopathy OU  - BP severely elevated on multiple separate measurements in office, but pt denies headache, numbness, tingling or any other symptoms related to BP Today's Vitals   09/05/18 1252  BP: (!) 168/89   - discussed importance of tight BP control and likely contribution of BP to retinal pathology  - recommended consultation with PCP for further evaluation and management of hypertension -- BP meds have been adjusted and BP readings stably improved today  - monitor  8. Mixed form age related cataract OU  - The  symptoms of cataract, surgical options, and treatments and risks were discussed with patient.  - discussed diagnosis and progression  - not yet visually significant  - monitor for  now    Ophthalmic Meds Ordered this visit:  Meds ordered this encounter  Medications  . Bevacizumab (AVASTIN) SOLN 1.25 mg       Return in about 4 weeks (around 10/03/2018) for DFE, OCT.  There are no Patient Instructions on file for this visit.   Explained the diagnoses, plan, and follow up with the patient and they expressed understanding.  Patient expressed understanding of the importance of proper follow up care.   This document serves as a record of services personally performed by Gardiner Sleeper, MD, PhD. It was created on their behalf by Ernest Mallick, OA, an ophthalmic assistant. The creation of this record is the provider's dictation and/or activities during the visit.    Electronically signed by: Ernest Mallick, OA  06.24.2020 12:59 PM     Gardiner Sleeper, M.D., Ph.D. Diseases & Surgery of the Retina and Vitreous [facility]  I have reviewed the above documentation for accuracy and completeness, and I agree with the above. Gardiner Sleeper, M.D., Ph.D. 09/05/18 12:59 PM    Abbreviations: M myopia (nearsighted); A astigmatism; H hyperopia (farsighted); P presbyopia; Mrx spectacle prescription;  CTL contact lenses; OD right eye; OS left eye; OU both eyes  XT exotropia; ET esotropia; PEK punctate epithelial keratitis; PEE punctate epithelial erosions; DES dry eye syndrome; MGD meibomian gland dysfunction; ATs artificial tears; PFAT's preservative free artificial tears; [place] nuclear sclerotic cataract; PSC posterior subcapsular cataract; ERM epi-retinal membrane; PVD posterior vitreous detachment; RD retinal detachment; DM diabetes mellitus; DR diabetic retinopathy; NPDR non-proliferative diabetic retinopathy; PDR proliferative diabetic retinopathy; CSME clinically significant  macular edema; DME diabetic macular edema; dbh dot blot hemorrhages; CWS cotton wool spot; POAG primary open angle glaucoma; C/D cup-to-disc ratio; HVF humphrey visual field; GVF goldmann visual field; OCT optical coherence tomography; IOP intraocular pressure; BRVO Branch retinal vein occlusion; CRVO central retinal vein occlusion; CRAO central retinal artery occlusion; BRAO branch retinal artery occlusion; RT retinal tear; SB scleral buckle; PPV pars plana vitrectomy; VH Vitreous hemorrhage; PRP panretinal laser photocoagulation; IVK intravitreal kenalog; VMT vitreomacular traction; MH Macular hole;  NVD neovascularization of the disc; NVE neovascularization elsewhere; AREDS age related eye disease study; ARMD age related macular degeneration; POAG primary open angle glaucoma; EBMD epithelial/anterior basement membrane dystrophy; ACIOL anterior chamber intraocular lens; IOL intraocular lens; PCIOL posterior chamber intraocular lens; Phaco/IOL phacoemulsification with intraocular lens placement; [place] photorefractive keratectomy; LASIK laser assisted in situ keratomileusis; HTN hypertension; DM diabetes mellitus; COPD chronic obstructive pulmonary disease

## 2018-09-05 ENCOUNTER — Encounter (INDEPENDENT_AMBULATORY_CARE_PROVIDER_SITE_OTHER): Payer: Self-pay | Admitting: Ophthalmology

## 2018-09-05 ENCOUNTER — Ambulatory Visit (INDEPENDENT_AMBULATORY_CARE_PROVIDER_SITE_OTHER): Payer: Medicare Other | Admitting: Ophthalmology

## 2018-09-05 ENCOUNTER — Other Ambulatory Visit: Payer: Self-pay

## 2018-09-05 VITALS — BP 168/89

## 2018-09-05 DIAGNOSIS — H3561 Retinal hemorrhage, right eye: Secondary | ICD-10-CM

## 2018-09-05 DIAGNOSIS — H353211 Exudative age-related macular degeneration, right eye, with active choroidal neovascularization: Secondary | ICD-10-CM

## 2018-09-05 DIAGNOSIS — H35011 Changes in retinal vascular appearance, right eye: Secondary | ICD-10-CM | POA: Diagnosis not present

## 2018-09-05 DIAGNOSIS — H3581 Retinal edema: Secondary | ICD-10-CM

## 2018-09-05 DIAGNOSIS — H35033 Hypertensive retinopathy, bilateral: Secondary | ICD-10-CM | POA: Diagnosis not present

## 2018-09-05 DIAGNOSIS — I10 Essential (primary) hypertension: Secondary | ICD-10-CM

## 2018-09-05 DIAGNOSIS — H25813 Combined forms of age-related cataract, bilateral: Secondary | ICD-10-CM | POA: Diagnosis not present

## 2018-09-05 DIAGNOSIS — H353122 Nonexudative age-related macular degeneration, left eye, intermediate dry stage: Secondary | ICD-10-CM

## 2018-09-05 MED ORDER — BEVACIZUMAB CHEMO INJECTION 1.25MG/0.05ML SYRINGE FOR KALEIDOSCOPE
1.2500 mg | INTRAVITREAL | Status: AC | PRN
  Administered 2018-09-05: 1.25 mg via INTRAVITREAL

## 2018-10-03 ENCOUNTER — Other Ambulatory Visit: Payer: Self-pay | Admitting: Family Medicine

## 2018-10-13 DIAGNOSIS — H5203 Hypermetropia, bilateral: Secondary | ICD-10-CM | POA: Diagnosis not present

## 2018-10-13 DIAGNOSIS — H2513 Age-related nuclear cataract, bilateral: Secondary | ICD-10-CM | POA: Diagnosis not present

## 2018-10-22 ENCOUNTER — Encounter (INDEPENDENT_AMBULATORY_CARE_PROVIDER_SITE_OTHER): Payer: Medicare Other | Admitting: Ophthalmology

## 2018-10-30 DIAGNOSIS — H353211 Exudative age-related macular degeneration, right eye, with active choroidal neovascularization: Secondary | ICD-10-CM | POA: Diagnosis not present

## 2018-12-01 DIAGNOSIS — H35041 Retinal micro-aneurysms, unspecified, right eye: Secondary | ICD-10-CM | POA: Diagnosis not present

## 2018-12-01 DIAGNOSIS — H353211 Exudative age-related macular degeneration, right eye, with active choroidal neovascularization: Secondary | ICD-10-CM | POA: Diagnosis not present

## 2019-01-05 DIAGNOSIS — H35041 Retinal micro-aneurysms, unspecified, right eye: Secondary | ICD-10-CM | POA: Diagnosis not present

## 2019-02-12 DIAGNOSIS — H353211 Exudative age-related macular degeneration, right eye, with active choroidal neovascularization: Secondary | ICD-10-CM | POA: Diagnosis not present

## 2019-03-17 ENCOUNTER — Other Ambulatory Visit: Payer: Self-pay | Admitting: Family Medicine

## 2019-03-23 DIAGNOSIS — H353211 Exudative age-related macular degeneration, right eye, with active choroidal neovascularization: Secondary | ICD-10-CM | POA: Diagnosis not present

## 2019-04-09 ENCOUNTER — Other Ambulatory Visit: Payer: Self-pay | Admitting: *Deleted

## 2019-04-10 MED ORDER — ATENOLOL 50 MG PO TABS: 50.0000 mg | ORAL_TABLET | Freq: Two times a day (BID) | ORAL | 1 refills | Status: DC

## 2019-05-11 DIAGNOSIS — H353211 Exudative age-related macular degeneration, right eye, with active choroidal neovascularization: Secondary | ICD-10-CM | POA: Diagnosis not present

## 2019-05-11 DIAGNOSIS — H35041 Retinal micro-aneurysms, unspecified, right eye: Secondary | ICD-10-CM | POA: Diagnosis not present

## 2019-07-02 DIAGNOSIS — H353211 Exudative age-related macular degeneration, right eye, with active choroidal neovascularization: Secondary | ICD-10-CM | POA: Diagnosis not present

## 2019-08-11 ENCOUNTER — Ambulatory Visit
Admission: RE | Admit: 2019-08-11 | Discharge: 2019-08-11 | Disposition: A | Discharge status: Home / Self Care | Payer: Medicare Other | Source: Ambulatory Visit | Attending: Family Medicine | Admitting: Family Medicine

## 2019-08-11 ENCOUNTER — Ambulatory Visit (INDEPENDENT_AMBULATORY_CARE_PROVIDER_SITE_OTHER): Payer: Medicare Other | Admitting: Family Medicine

## 2019-08-11 ENCOUNTER — Other Ambulatory Visit: Payer: Self-pay

## 2019-08-11 VITALS — BP 165/70 | HR 65 | Ht 59.0 in | Wt 162.6 lb

## 2019-08-11 DIAGNOSIS — Z96651 Presence of right artificial knee joint: Secondary | ICD-10-CM | POA: Diagnosis not present

## 2019-08-11 DIAGNOSIS — G8929 Other chronic pain: Secondary | ICD-10-CM

## 2019-08-11 DIAGNOSIS — M25561 Pain in right knee: Secondary | ICD-10-CM

## 2019-08-11 DIAGNOSIS — I10 Essential (primary) hypertension: Secondary | ICD-10-CM | POA: Diagnosis not present

## 2019-08-11 DIAGNOSIS — M7989 Other specified soft tissue disorders: Secondary | ICD-10-CM | POA: Diagnosis not present

## 2019-08-11 DIAGNOSIS — Z471 Aftercare following joint replacement surgery: Secondary | ICD-10-CM | POA: Diagnosis not present

## 2019-08-11 MED ORDER — TETANUS-DIPHTH-ACELL PERTUSSIS 5-2.5-18.5 LF-MCG/0.5 IM SUSP: 0.5000 mL | Freq: Once | INTRAMUSCULAR | 0 refills | Status: AC

## 2019-08-11 NOTE — Patient Instructions (Addendum)
It was great to see you again today!  Checking labs today - kidneys, liver, cholesterol  Go get knee xray - can go to [facility]  Take prescription to pharmacy and get your tetanus shot  I recommend you get the COVID vaccination. You can go to [email or web address] for information on how to get this vaccine.  Add amlodipine 5mg  daily for blood pressure control since it was elevated today  Schedule nurse visit in 2-3 weeks to recheck blood pressure and get pneumonia shot.  Be well, Dr. 

## 2019-08-11 NOTE — Progress Notes (Signed)
  Date of Visit: 08/11/2019   SUBJECTIVE:   HPI:  Erin Baldwin presents today for routine follow up.  Hypertension - currently taking atenolol 50mg  twice daily. Tolerating well. Does not check blood pressure at home. Prev was prescribed amlodipine but she has not taken as she does not think she needs it.  R knee pain - history of prior knee replacement on R years ago, also has had bilateral hip replacements. Last summer her granddaughter was doing a cartwheel and kicked her hard in the knee. Since then has had pain, especially on medial aspect of knee.   OBJECTIVE:   BP (!) 165/70   Pulse 65   Ht 4\' 11"  (1.499 m)   Wt 162 lb 9.6 oz (73.8 kg)   SpO2 97%   BMI 32.84 kg/m  Gen: no acute distress, pleasant, cooperative HEENT: normocephalic, atraumatic  Heart: regular rate and rhythm, no murmur Lungs: clear to auscultation bilaterally, normal work of breathing  Neuro: alert, speech normal, grossly nonfocal Ext: R knee tender to palpation along medial joint line. No effusion appreciated though habitus limits exam. No warmth or erythema. Full strength with knee flexion & extension. Negative lachmans. Intact to varus & valgus stressing.  ASSESSMENT/PLAN:   Health maintenance:  -strongly encouraged COVID vaccination, provided info on how to schedule. -will get pneumovax at nurse visit in a few weeks -gave rx for Tdap, advised to get at her pharmacy  Essential hypertension, benign Blood pressure elevated including on recheck Add amlodipine 5mg  daily Schedule RN visit to recheck blood pressure in a few weeks Check CMET and lipids today  Right knee pain History of knee replacement, with pain since trauma to knee last summer Exam with medial joint line tenderness Check xrays today Anticipate need for ortho referral  FOLLOW UP: Follow up in few weeks for RN visit for blood pressure check & pneumovax  J. , MD [facility]

## 2019-08-12 LAB — CMP14+EGFR
ALT: 13 IU/L (ref 0–32)
AST: 23 IU/L (ref 0–40)
Albumin/Globulin Ratio: 1.6 (ref 1.2–2.2)
Albumin: 4.4 g/dL (ref 3.5–4.6)
Alkaline Phosphatase: 79 IU/L (ref 48–121)
BUN/Creatinine Ratio: 27 (ref 12–28)
BUN: 20 mg/dL (ref 10–36)
Bilirubin Total: 0.6 mg/dL (ref 0.0–1.2)
CO2: 21 mmol/L (ref 20–29)
Calcium: 10 mg/dL (ref 8.7–10.3)
Chloride: 104 mmol/L (ref 96–106)
Creatinine, Ser: 0.73 mg/dL (ref 0.57–1.00)
GFR calc Af Amer: 83 mL/min/{1.73_m2} (ref >59)
GFR calc non Af Amer: 72 mL/min/{1.73_m2} (ref >59)
Globulin, Total: 2.8 g/dL (ref 1.5–4.5)
Glucose: 93 mg/dL (ref 65–99)
Potassium: 5.4 mmol/L — ABNORMAL HIGH (ref 3.5–5.2)
Sodium: 141 mmol/L (ref 134–144)
Total Protein: 7.2 g/dL (ref 6.0–8.5)

## 2019-08-12 LAB — LIPID PANEL
Chol/HDL Ratio: 2.6 ratio (ref 0.0–4.4)
Cholesterol, Total: 225 mg/dL — ABNORMAL HIGH (ref 100–199)
HDL: 85 mg/dL (ref >39)
LDL Chol Calc (NIH): 117 mg/dL — ABNORMAL HIGH (ref 0–99)
Triglycerides: 136 mg/dL (ref 0–149)
VLDL Cholesterol Cal: 23 mg/dL (ref 5–40)

## 2019-08-12 MED ORDER — AMLODIPINE BESYLATE 5 MG PO TABS: ORAL_TABLET | ORAL | 1 refills | Status: AC

## 2019-08-13 ENCOUNTER — Telehealth: Payer: Self-pay | Admitting: Family Medicine

## 2019-08-13 DIAGNOSIS — M25561 Pain in right knee: Secondary | ICD-10-CM | POA: Insufficient documentation

## 2019-08-13 DIAGNOSIS — E875 Hyperkalemia: Secondary | ICD-10-CM

## 2019-08-13 NOTE — Telephone Encounter (Signed)
Attempted to reach patient to discuss lab and xray results. No answer on either number. Did not leave VM  Her knee xray looks fine. I recommend referring her to an orthopedic specialist for her pain.  Her labs look good, except her potassium is a little elevated. I would like to recheck this when she comes for the nurse visit in a few weeks.  Please let me know if patient returns call so I can speak with her.  Thanks Latrelle Dodrill, MD

## 2019-08-13 NOTE — Assessment & Plan Note (Signed)
History of knee replacement, with pain since trauma to knee last summer Exam with medial joint line tenderness Check xrays today Anticipate need for ortho referral

## 2019-08-13 NOTE — Assessment & Plan Note (Signed)
Blood pressure elevated including on recheck Add amlodipine 5mg  daily Schedule RN visit to recheck blood pressure in a few weeks Check CMET and lipids today

## 2019-08-14 NOTE — Telephone Encounter (Signed)
Attempted to reach patient again, no answer. Left HIPAA compliant voicemail asking her to call back.  Latrelle Dodrill, MD

## 2019-08-18 NOTE — Telephone Encounter (Signed)
Pt is returning the call to Dr. Pollie Meyer. The patient said that she has a hard time hearing the phone. Ms. Buckles asked that if or her son does not answer she could leave a detailed message about her labs and x-rays on the voice for her phone or her sons phone. jw

## 2019-08-21 NOTE — Telephone Encounter (Signed)
Called and LVM on voicemail with results, adivsing recheck of bmet and reminded patient she needs to schedule RN BP check Also advised that I can refer to an orthopedist if she would like  Latrelle Dodrill, MD

## 2019-09-01 ENCOUNTER — Ambulatory Visit: Payer: Medicare Other

## 2019-09-01 ENCOUNTER — Other Ambulatory Visit: Payer: Medicare Other

## 2019-09-08 ENCOUNTER — Ambulatory Visit: Payer: Medicare Other

## 2019-09-08 ENCOUNTER — Other Ambulatory Visit: Payer: Medicare Other

## 2019-09-29 ENCOUNTER — Ambulatory Visit (INDEPENDENT_AMBULATORY_CARE_PROVIDER_SITE_OTHER): Payer: Medicare Other

## 2019-09-29 ENCOUNTER — Other Ambulatory Visit: Payer: Medicare Other

## 2019-09-29 ENCOUNTER — Other Ambulatory Visit: Payer: Self-pay

## 2019-09-29 DIAGNOSIS — E875 Hyperkalemia: Secondary | ICD-10-CM | POA: Diagnosis not present

## 2019-09-29 DIAGNOSIS — Z23 Encounter for immunization: Secondary | ICD-10-CM

## 2019-09-29 DIAGNOSIS — I10 Essential (primary) hypertension: Secondary | ICD-10-CM

## 2019-09-29 NOTE — Progress Notes (Signed)
Patient presents in nurse clinic for BP check.      Last BP was on 06/01/2021and was 165/70 HR:65.  BP today is 146/66 with a pulse of 64.    Checked BP in left arm with large cuff.    Symptoms present: none.   Patient last took BP med Atenolol today. Patient takes another Atenolol and Amlodipine in evenings.   Patient reports she needs a refill on Amlodipine.

## 2019-09-30 LAB — BASIC METABOLIC PANEL
BUN/Creatinine Ratio: 24 (ref 12–28)
BUN: 15 mg/dL (ref 10–36)
CO2: 20 mmol/L (ref 20–29)
Calcium: 9.5 mg/dL (ref 8.7–10.3)
Chloride: 106 mmol/L (ref 96–106)
Creatinine, Ser: 0.63 mg/dL (ref 0.57–1.00)
GFR calc Af Amer: 90 mL/min/{1.73_m2} (ref >59)
GFR calc non Af Amer: 78 mL/min/{1.73_m2} (ref >59)
Glucose: 97 mg/dL (ref 65–99)
Potassium: 4.6 mmol/L (ref 3.5–5.2)
Sodium: 140 mmol/L (ref 134–144)

## 2019-10-07 ENCOUNTER — Telehealth: Payer: Self-pay

## 2019-10-07 NOTE — Telephone Encounter (Signed)
Patient LVM on nurse line requesting lab results from 7/20. Please advise.

## 2019-10-08 ENCOUNTER — Encounter: Payer: Self-pay | Admitting: Family Medicine

## 2019-10-08 DIAGNOSIS — H353211 Exudative age-related macular degeneration, right eye, with active choroidal neovascularization: Secondary | ICD-10-CM | POA: Diagnosis not present

## 2019-10-08 NOTE — Telephone Encounter (Signed)
Called patient x3, unable to reach. LVM stating labs were normal.  Latrelle Dodrill, MD

## 2019-11-11 ENCOUNTER — Other Ambulatory Visit: Payer: Self-pay | Admitting: Family Medicine

## 2019-11-26 DIAGNOSIS — H353211 Exudative age-related macular degeneration, right eye, with active choroidal neovascularization: Secondary | ICD-10-CM | POA: Diagnosis not present

## 2020-01-07 DIAGNOSIS — I10 Essential (primary) hypertension: Secondary | ICD-10-CM | POA: Diagnosis not present

## 2020-01-07 DIAGNOSIS — E785 Hyperlipidemia, unspecified: Secondary | ICD-10-CM | POA: Diagnosis not present

## 2020-01-11 DIAGNOSIS — H353211 Exudative age-related macular degeneration, right eye, with active choroidal neovascularization: Secondary | ICD-10-CM | POA: Diagnosis not present

## 2020-05-04 DIAGNOSIS — I10 Essential (primary) hypertension: Secondary | ICD-10-CM | POA: Diagnosis not present

## 2020-05-04 DIAGNOSIS — R21 Rash and other nonspecific skin eruption: Secondary | ICD-10-CM | POA: Diagnosis not present

## 2020-06-17 IMAGING — CR DG KNEE COMPLETE 4+V*R*
5 series · 5 of 5 positions shown · non-contrast
Comparison: None.

CLINICAL DATA: Chronic pain and swelling

EXAM:
RIGHT KNEE - COMPLETE 4+ VIEW

[t knee ap right]
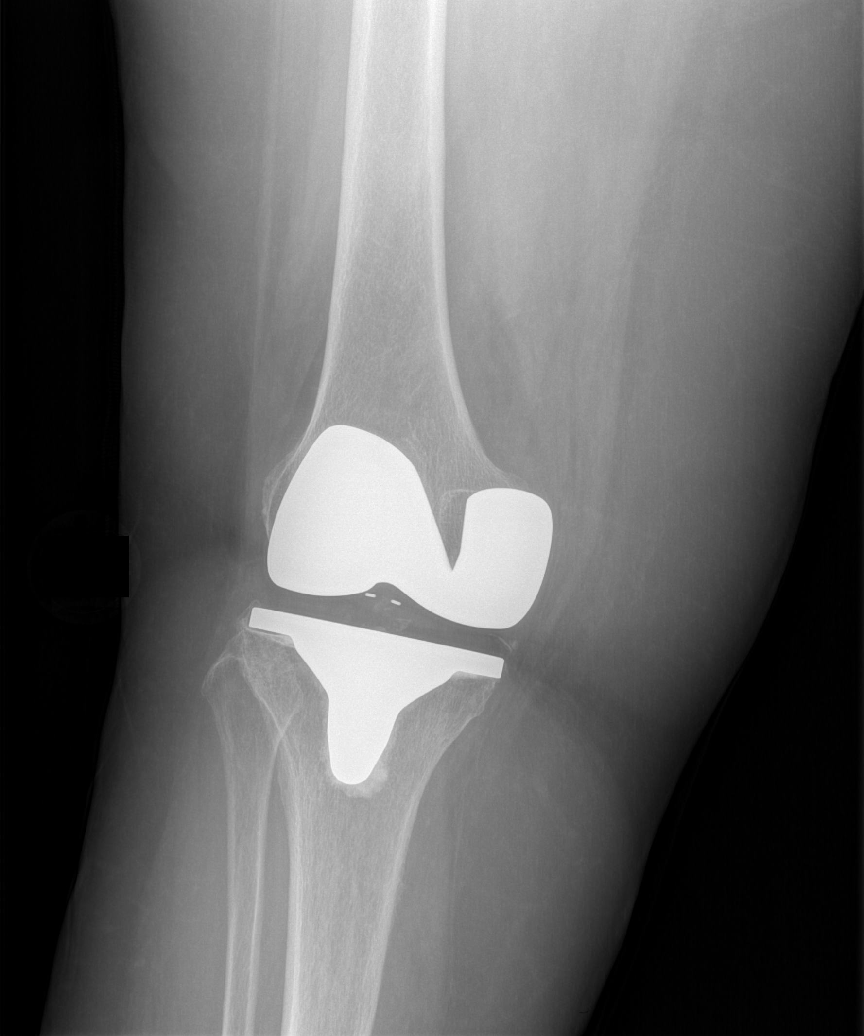

[t knee oblique right (1 of 3)]
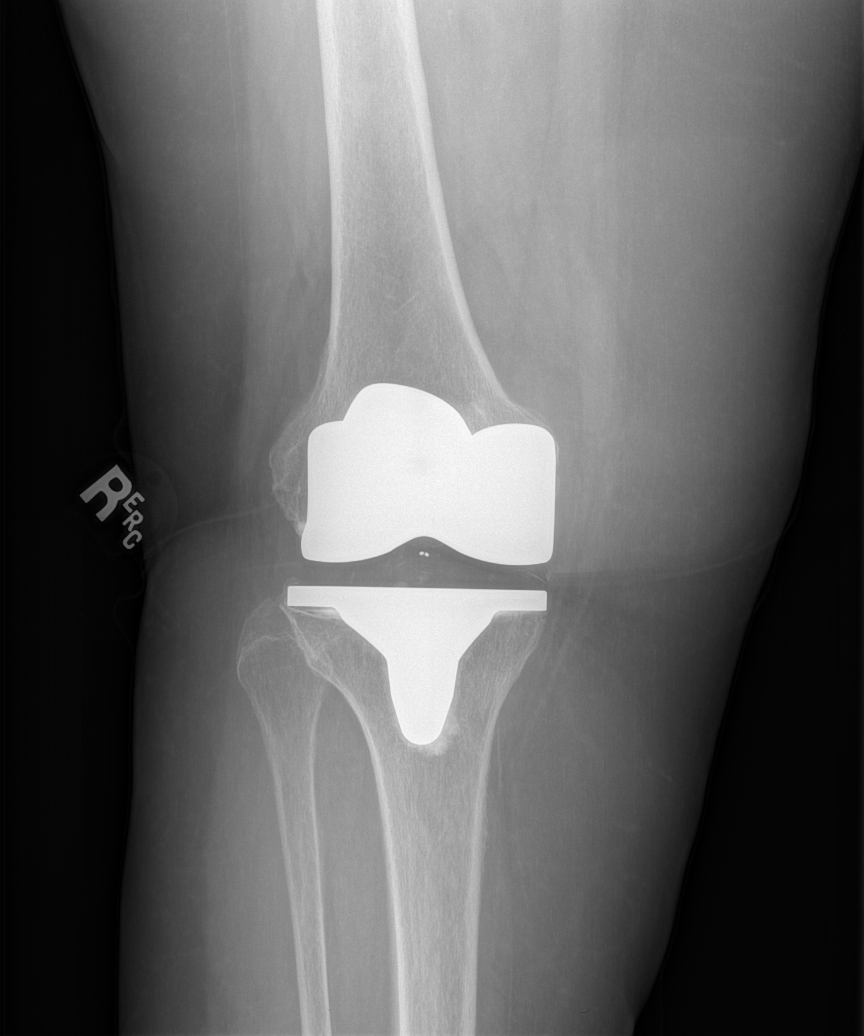

[t knee oblique right (2 of 3)]
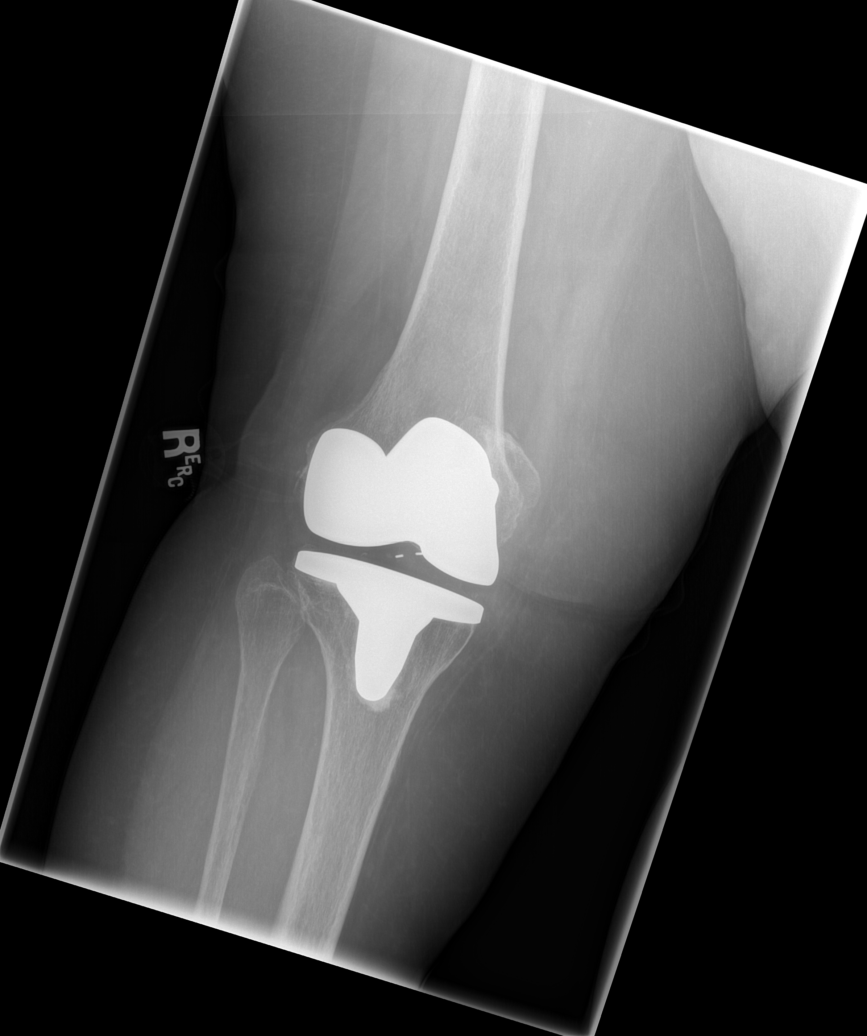

[t knee oblique right (3 of 3)]
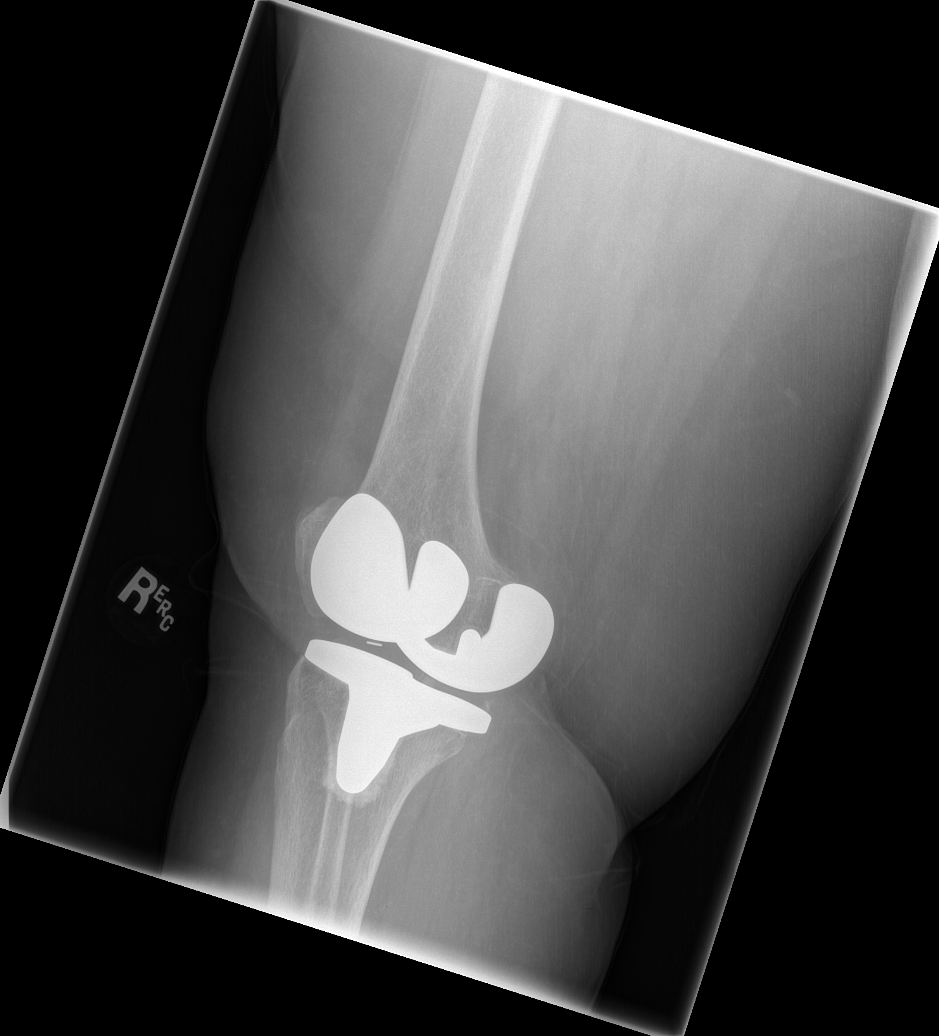

[t knee lat right]
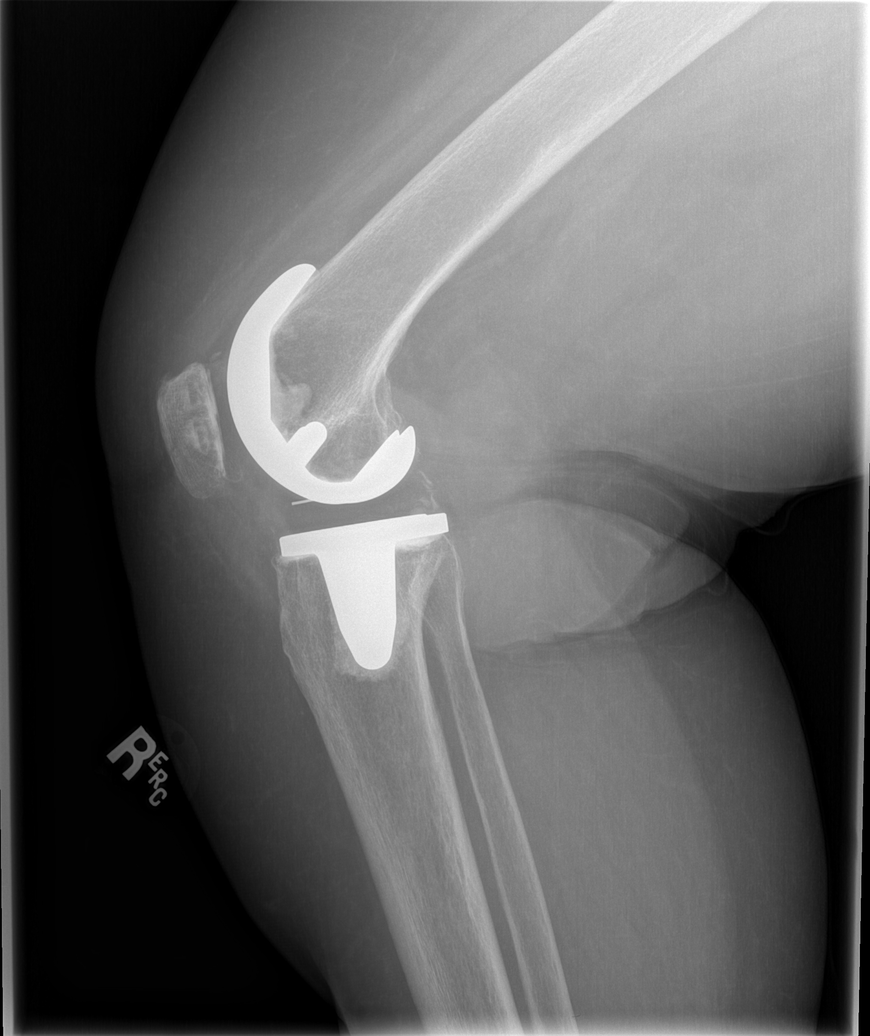

[5 of 5 positions shown; findings below may reference images not displayed]

FINDINGS: Frontal, lateral, and bilateral oblique views were obtained. Patient
is status post total knee replacement with femoral and tibial
prosthetic components well-seated. No fracture or dislocation. No
joint effusion. No erosive change.
IMPRESSION: Status post total knee replacement with prosthetic components
well-seated. No fracture or dislocation. No joint effusion. No
erosion.

## 2020-06-24 ENCOUNTER — Other Ambulatory Visit: Payer: Self-pay | Admitting: Family Medicine

## 2020-07-04 DIAGNOSIS — H353211 Exudative age-related macular degeneration, right eye, with active choroidal neovascularization: Secondary | ICD-10-CM | POA: Diagnosis not present

## 2020-08-18 DIAGNOSIS — H353211 Exudative age-related macular degeneration, right eye, with active choroidal neovascularization: Secondary | ICD-10-CM | POA: Diagnosis not present

## 2020-10-27 DIAGNOSIS — H353211 Exudative age-related macular degeneration, right eye, with active choroidal neovascularization: Secondary | ICD-10-CM | POA: Diagnosis not present

## 2020-10-31 ENCOUNTER — Telehealth: Payer: Self-pay

## 2020-10-31 NOTE — Telephone Encounter (Signed)
Placed call to pt to schedule AWV. LMTCB

## 2020-11-08 DIAGNOSIS — I10 Essential (primary) hypertension: Secondary | ICD-10-CM | POA: Diagnosis not present

## 2020-11-08 DIAGNOSIS — M25471 Effusion, right ankle: Secondary | ICD-10-CM | POA: Diagnosis not present

## 2020-11-08 DIAGNOSIS — M25512 Pain in left shoulder: Secondary | ICD-10-CM | POA: Diagnosis not present

## 2020-11-08 DIAGNOSIS — Z0000 Encounter for general adult medical examination without abnormal findings: Secondary | ICD-10-CM | POA: Diagnosis not present

## 2020-11-08 DIAGNOSIS — E876 Hypokalemia: Secondary | ICD-10-CM | POA: Diagnosis not present

## 2020-11-08 DIAGNOSIS — M25472 Effusion, left ankle: Secondary | ICD-10-CM | POA: Diagnosis not present

## 2020-11-08 DIAGNOSIS — M25511 Pain in right shoulder: Secondary | ICD-10-CM | POA: Diagnosis not present
